# Patient Record
Sex: Female | Born: 1950 | Race: White | Hispanic: No | Marital: Married | Smoking: Former smoker
Health system: Southern US, Community
[De-identification: ages and names within clinical notes are randomized; demographics above are authoritative.]

## PROBLEM LIST (undated history)

## (undated) DIAGNOSIS — R55 Syncope and collapse: Secondary | ICD-10-CM

## (undated) DIAGNOSIS — K219 Gastro-esophageal reflux disease without esophagitis: Secondary | ICD-10-CM

## (undated) DIAGNOSIS — Z950 Presence of cardiac pacemaker: Secondary | ICD-10-CM

## (undated) DIAGNOSIS — E785 Hyperlipidemia, unspecified: Secondary | ICD-10-CM

## (undated) DIAGNOSIS — I469 Cardiac arrest, cause unspecified: Secondary | ICD-10-CM

## (undated) HISTORY — PX: BREAST LUMPECTOMY: SHX2

## (undated) HISTORY — DX: Hyperlipidemia, unspecified: E78.5

## (undated) HISTORY — DX: Presence of cardiac pacemaker: Z95.0

## (undated) HISTORY — DX: Gastro-esophageal reflux disease without esophagitis: K21.9

## (undated) HISTORY — DX: Cardiac arrest, cause unspecified: I46.9

## (undated) HISTORY — DX: Syncope and collapse: R55

## (undated) HISTORY — PX: DILATION AND CURETTAGE OF UTERUS: SHX78

---

## 1998-08-06 HISTORY — PX: BREAST EXCISIONAL BIOPSY: SUR124

## 1998-10-28 ENCOUNTER — Ambulatory Visit (HOSPITAL_COMMUNITY): Admission: RE | Admit: 1998-10-28 | Discharge: 1998-10-28 | Payer: Self-pay

## 1999-12-05 ENCOUNTER — Encounter: Admission: RE | Admit: 1999-12-05 | Discharge: 1999-12-05 | Payer: Self-pay | Admitting: Gynecology

## 1999-12-05 ENCOUNTER — Encounter: Payer: Self-pay | Admitting: Gynecology

## 2001-03-04 ENCOUNTER — Encounter: Admission: RE | Admit: 2001-03-04 | Discharge: 2001-03-04 | Payer: Self-pay | Admitting: Gynecology

## 2001-03-04 ENCOUNTER — Encounter: Payer: Self-pay | Admitting: Gynecology

## 2001-03-06 ENCOUNTER — Other Ambulatory Visit: Admission: RE | Admit: 2001-03-06 | Discharge: 2001-03-06 | Payer: Self-pay | Admitting: Gynecology

## 2001-03-28 ENCOUNTER — Ambulatory Visit (HOSPITAL_COMMUNITY): Admission: RE | Admit: 2001-03-28 | Discharge: 2001-03-28 | Payer: Self-pay | Admitting: Endocrinology

## 2001-03-28 ENCOUNTER — Encounter: Payer: Self-pay | Admitting: Endocrinology

## 2002-01-29 ENCOUNTER — Encounter (INDEPENDENT_AMBULATORY_CARE_PROVIDER_SITE_OTHER): Payer: Self-pay | Admitting: Specialist

## 2002-01-29 ENCOUNTER — Ambulatory Visit (HOSPITAL_COMMUNITY): Admission: RE | Admit: 2002-01-29 | Discharge: 2002-01-29 | Payer: Self-pay | Admitting: *Deleted

## 2002-03-20 ENCOUNTER — Encounter: Payer: Self-pay | Admitting: Gynecology

## 2002-03-20 ENCOUNTER — Encounter: Admission: RE | Admit: 2002-03-20 | Discharge: 2002-03-20 | Payer: Self-pay | Admitting: Gynecology

## 2002-03-24 ENCOUNTER — Other Ambulatory Visit: Admission: RE | Admit: 2002-03-24 | Discharge: 2002-03-24 | Payer: Self-pay | Admitting: Gynecology

## 2003-04-13 ENCOUNTER — Other Ambulatory Visit: Admission: RE | Admit: 2003-04-13 | Discharge: 2003-04-13 | Payer: Self-pay | Admitting: Gynecology

## 2003-05-07 ENCOUNTER — Encounter: Payer: Self-pay | Admitting: Gynecology

## 2003-05-07 ENCOUNTER — Encounter: Admission: RE | Admit: 2003-05-07 | Discharge: 2003-05-07 | Payer: Self-pay | Admitting: Gynecology

## 2004-05-18 ENCOUNTER — Other Ambulatory Visit: Admission: RE | Admit: 2004-05-18 | Discharge: 2004-05-18 | Payer: Self-pay | Admitting: Gynecology

## 2004-06-02 ENCOUNTER — Encounter: Admission: RE | Admit: 2004-06-02 | Discharge: 2004-06-02 | Payer: Self-pay | Admitting: Gynecology

## 2005-06-04 ENCOUNTER — Encounter: Admission: RE | Admit: 2005-06-04 | Discharge: 2005-06-04 | Payer: Self-pay | Admitting: Gynecology

## 2005-06-04 ENCOUNTER — Other Ambulatory Visit: Admission: RE | Admit: 2005-06-04 | Discharge: 2005-06-04 | Payer: Self-pay | Admitting: Gynecology

## 2006-05-05 ENCOUNTER — Emergency Department (HOSPITAL_COMMUNITY): Admission: EM | Admit: 2006-05-05 | Discharge: 2006-05-05 | Payer: Self-pay | Admitting: Emergency Medicine

## 2006-06-13 ENCOUNTER — Encounter: Admission: RE | Admit: 2006-06-13 | Discharge: 2006-06-13 | Payer: Self-pay | Admitting: Gynecology

## 2006-06-13 ENCOUNTER — Other Ambulatory Visit: Admission: RE | Admit: 2006-06-13 | Discharge: 2006-06-13 | Payer: Self-pay | Admitting: Gynecology

## 2006-07-05 ENCOUNTER — Ambulatory Visit (HOSPITAL_COMMUNITY): Admission: RE | Admit: 2006-07-05 | Discharge: 2006-07-05 | Payer: Self-pay | Admitting: *Deleted

## 2006-12-31 ENCOUNTER — Encounter: Admission: RE | Admit: 2006-12-31 | Discharge: 2006-12-31 | Payer: Self-pay | Admitting: Endocrinology

## 2007-01-09 ENCOUNTER — Ambulatory Visit: Payer: Self-pay | Admitting: Internal Medicine

## 2007-01-14 ENCOUNTER — Ambulatory Visit (HOSPITAL_COMMUNITY): Admission: RE | Admit: 2007-01-14 | Discharge: 2007-01-14 | Payer: Self-pay | Admitting: Internal Medicine

## 2007-04-25 ENCOUNTER — Encounter: Admission: RE | Admit: 2007-04-25 | Discharge: 2007-04-25 | Payer: Self-pay | Admitting: Endocrinology

## 2007-06-16 ENCOUNTER — Encounter: Admission: RE | Admit: 2007-06-16 | Discharge: 2007-06-16 | Payer: Self-pay | Admitting: Gynecology

## 2008-01-13 ENCOUNTER — Encounter: Admission: RE | Admit: 2008-01-13 | Discharge: 2008-01-13 | Payer: Self-pay | Admitting: Endocrinology

## 2008-02-04 ENCOUNTER — Telehealth (INDEPENDENT_AMBULATORY_CARE_PROVIDER_SITE_OTHER): Payer: Self-pay | Admitting: *Deleted

## 2008-02-16 ENCOUNTER — Encounter: Admission: RE | Admit: 2008-02-16 | Discharge: 2008-02-16 | Payer: Self-pay | Admitting: Internal Medicine

## 2008-05-05 ENCOUNTER — Encounter: Admission: RE | Admit: 2008-05-05 | Discharge: 2008-05-05 | Payer: Self-pay | Admitting: Otolaryngology

## 2008-06-17 ENCOUNTER — Encounter: Admission: RE | Admit: 2008-06-17 | Discharge: 2008-06-17 | Payer: Self-pay | Admitting: Gynecology

## 2008-08-15 ENCOUNTER — Inpatient Hospital Stay (HOSPITAL_COMMUNITY): Admission: EM | Admit: 2008-08-15 | Discharge: 2008-08-18 | Payer: Self-pay | Admitting: Emergency Medicine

## 2008-08-16 ENCOUNTER — Encounter (INDEPENDENT_AMBULATORY_CARE_PROVIDER_SITE_OTHER): Payer: Self-pay | Admitting: Emergency Medicine

## 2008-08-16 ENCOUNTER — Ambulatory Visit: Payer: Self-pay | Admitting: *Deleted

## 2008-08-16 ENCOUNTER — Encounter (INDEPENDENT_AMBULATORY_CARE_PROVIDER_SITE_OTHER): Payer: Self-pay | Admitting: Internal Medicine

## 2008-08-17 DIAGNOSIS — Z950 Presence of cardiac pacemaker: Secondary | ICD-10-CM

## 2008-08-17 HISTORY — DX: Presence of cardiac pacemaker: Z95.0

## 2008-08-17 HISTORY — PX: PERMANENT PACEMAKER INSERTION: SHX6023

## 2008-12-08 ENCOUNTER — Encounter: Admission: RE | Admit: 2008-12-08 | Discharge: 2008-12-08 | Payer: Self-pay | Admitting: Endocrinology

## 2009-06-21 ENCOUNTER — Encounter: Admission: RE | Admit: 2009-06-21 | Discharge: 2009-06-21 | Payer: Self-pay | Admitting: Gynecology

## 2010-02-28 IMAGING — CT CT HEAD W/O CM
1 series · 16 of 30 positions shown, 20 images · non-contrast
Comparison: None

CLINICAL DATA: Syncope, fall.

CT HEAD WITHOUT CONTRAST
TECHNIQUE: Contiguous axial images were obtained from the base of
the skull through the vertex without contrast.

[Series 2: head trauma 4.8 h37s · axial · 0.45mm/px · z∈[-97,+38]mm · 16 of 30 slices shown, 20 images]
[im 2/30  brain]
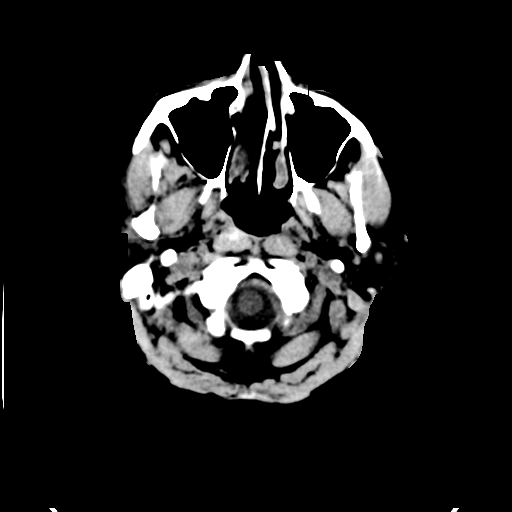
[im 2/30  bone]
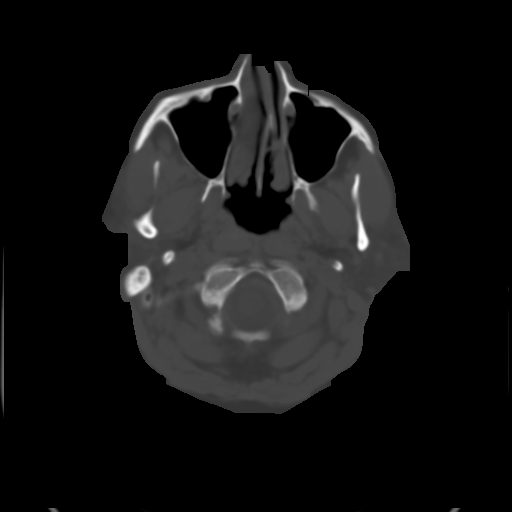
[im 4/30  brain]
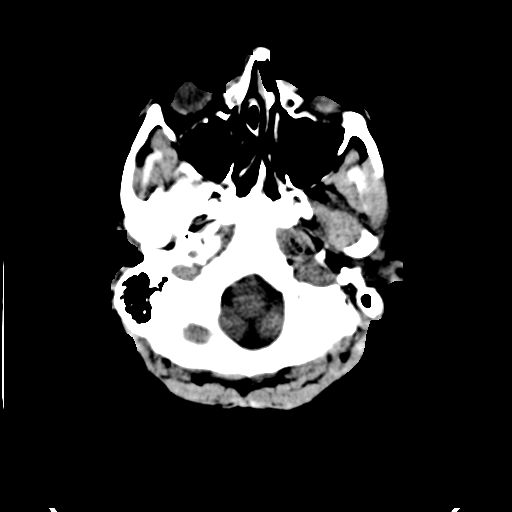
[im 6/30  brain]
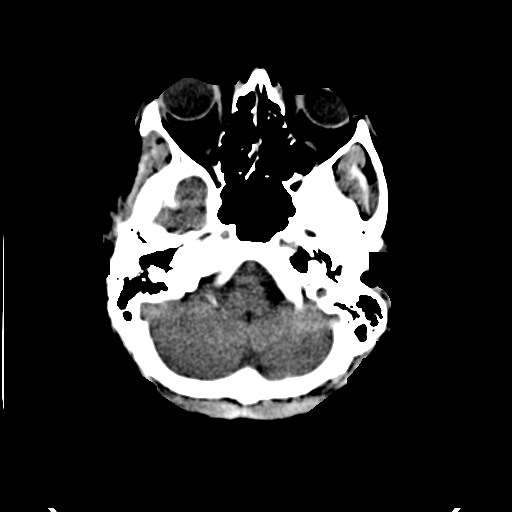
[im 8/30  brain]
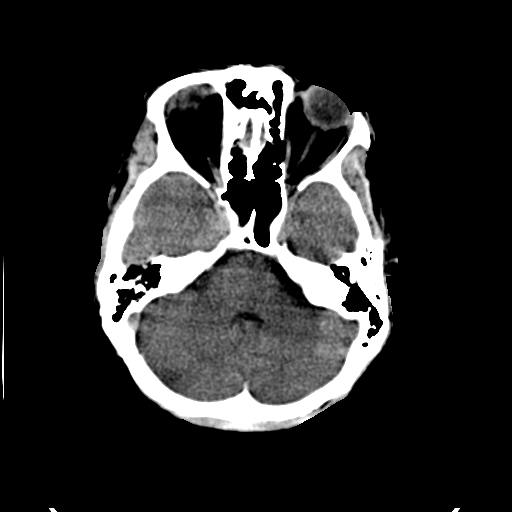
[im 9/30  brain]
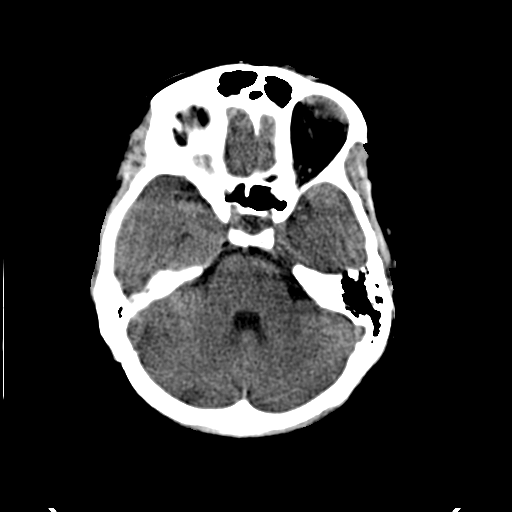
[im 9/30  bone]
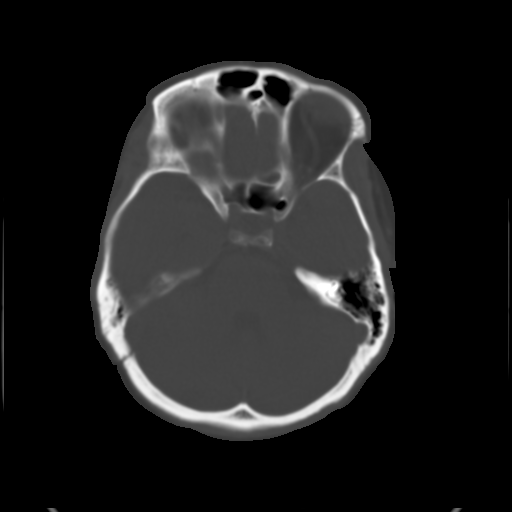
[im 11/30  brain]
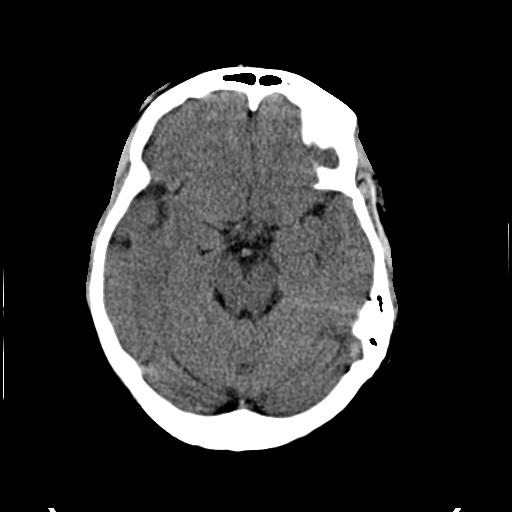
[im 13/30  brain]
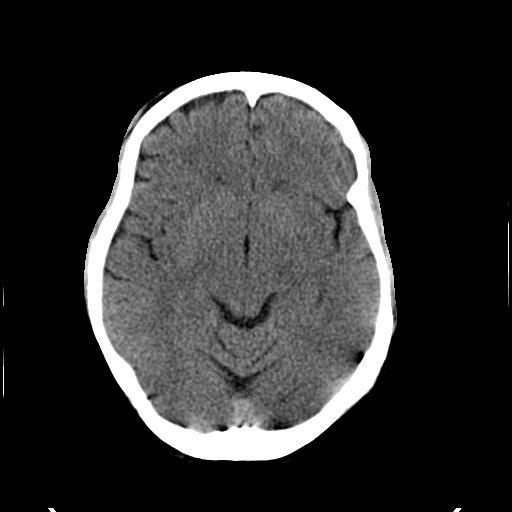
[im 15/30  brain]
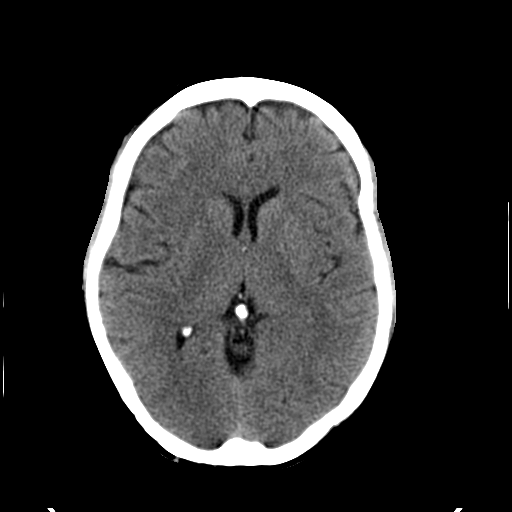
[im 16/30  brain]
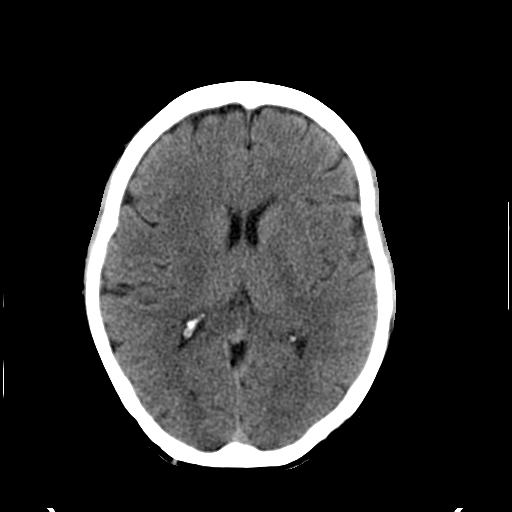
[im 16/30  bone]
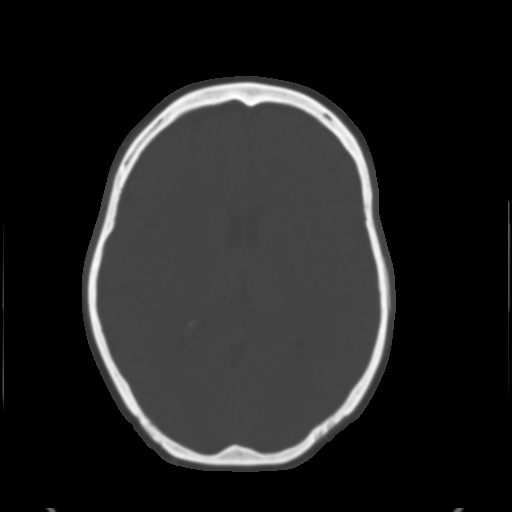
[im 18/30  brain]
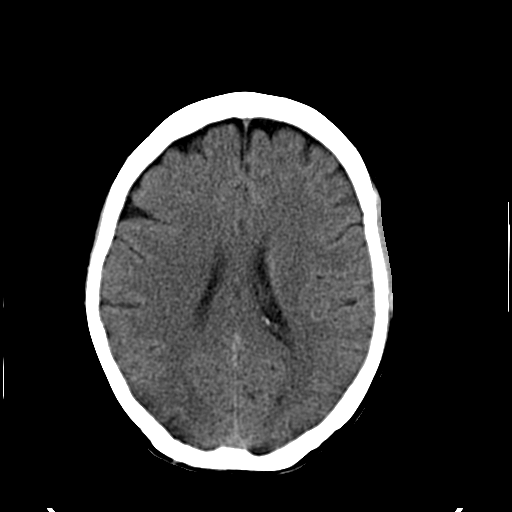
[im 20/30  brain]
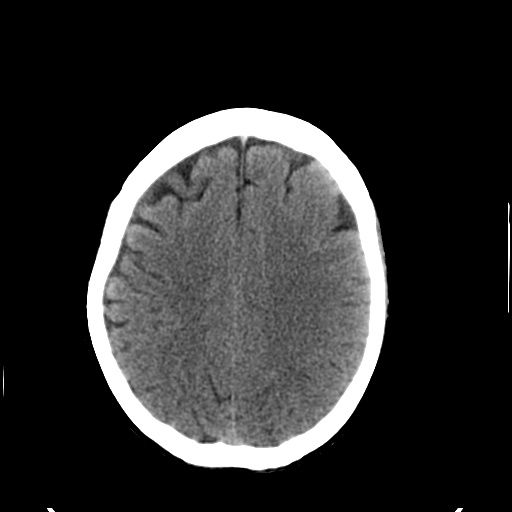
[im 22/30  brain]
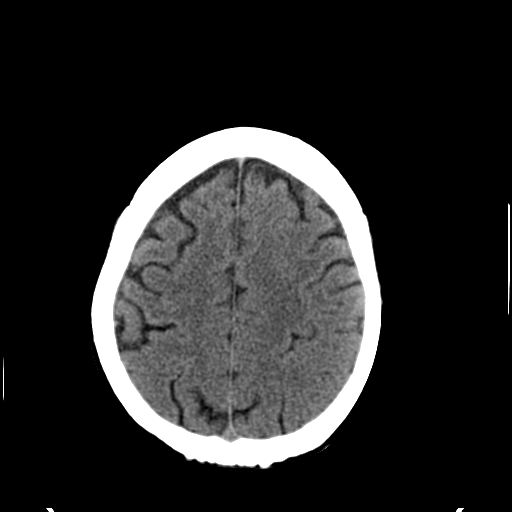
[im 23/30  brain]
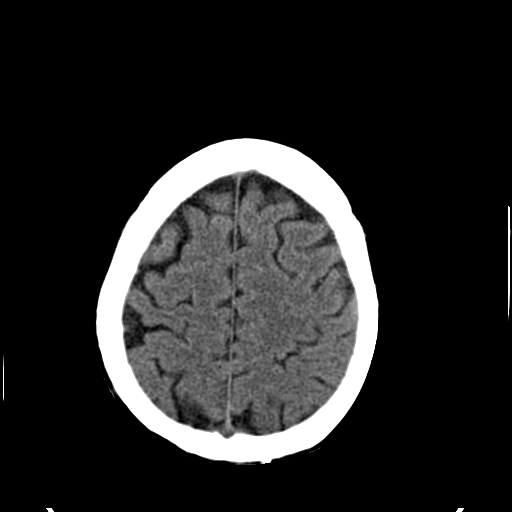
[im 23/30  bone]
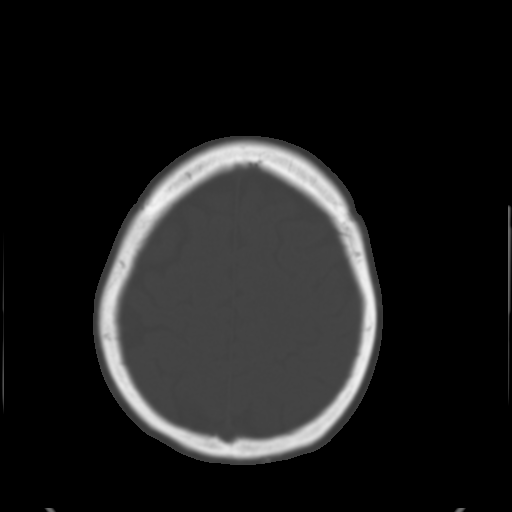
[im 25/30  brain]
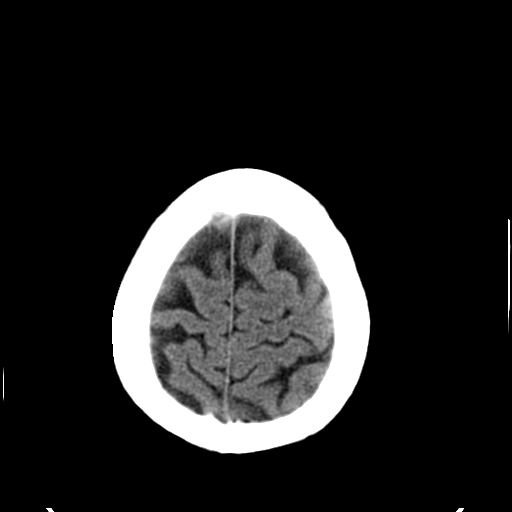
[im 27/30  brain]
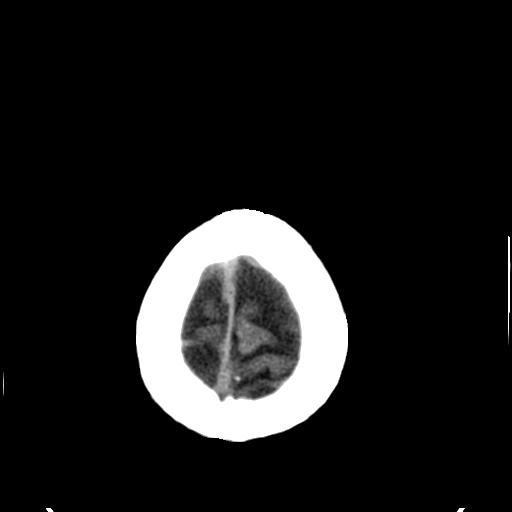
[im 29/30  brain]
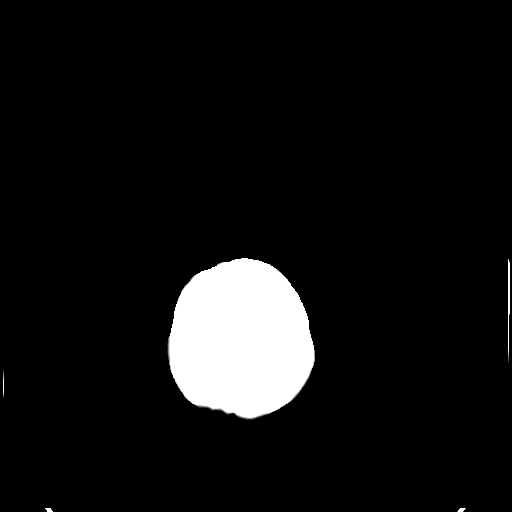

[16 of 30 positions shown; findings below may reference images not displayed]

FINDINGS: Soft tissue swelling noted over the right side of the
forehead. No acute intracranial abnormality.  Specifically, no
hemorrhage, hydrocephalus, mass lesion, acute infarction, or
significant intracranial injury.  No acute calvarial abnormality.

Visualized paranasal sinuses and mastoids clear.  Orbital soft
tissues unremarkable.
IMPRESSION: No acute intracranial abnormality.

## 2010-03-01 IMAGING — CR DG CHEST 2V
2 series · 2 of 2 positions shown · non-contrast
Comparison: [facility] images for chest CT dated 02/16/2008

CLINICAL DATA: Multiple syncopal episodes.

CHEST - 2 VIEW

[w chest pa]
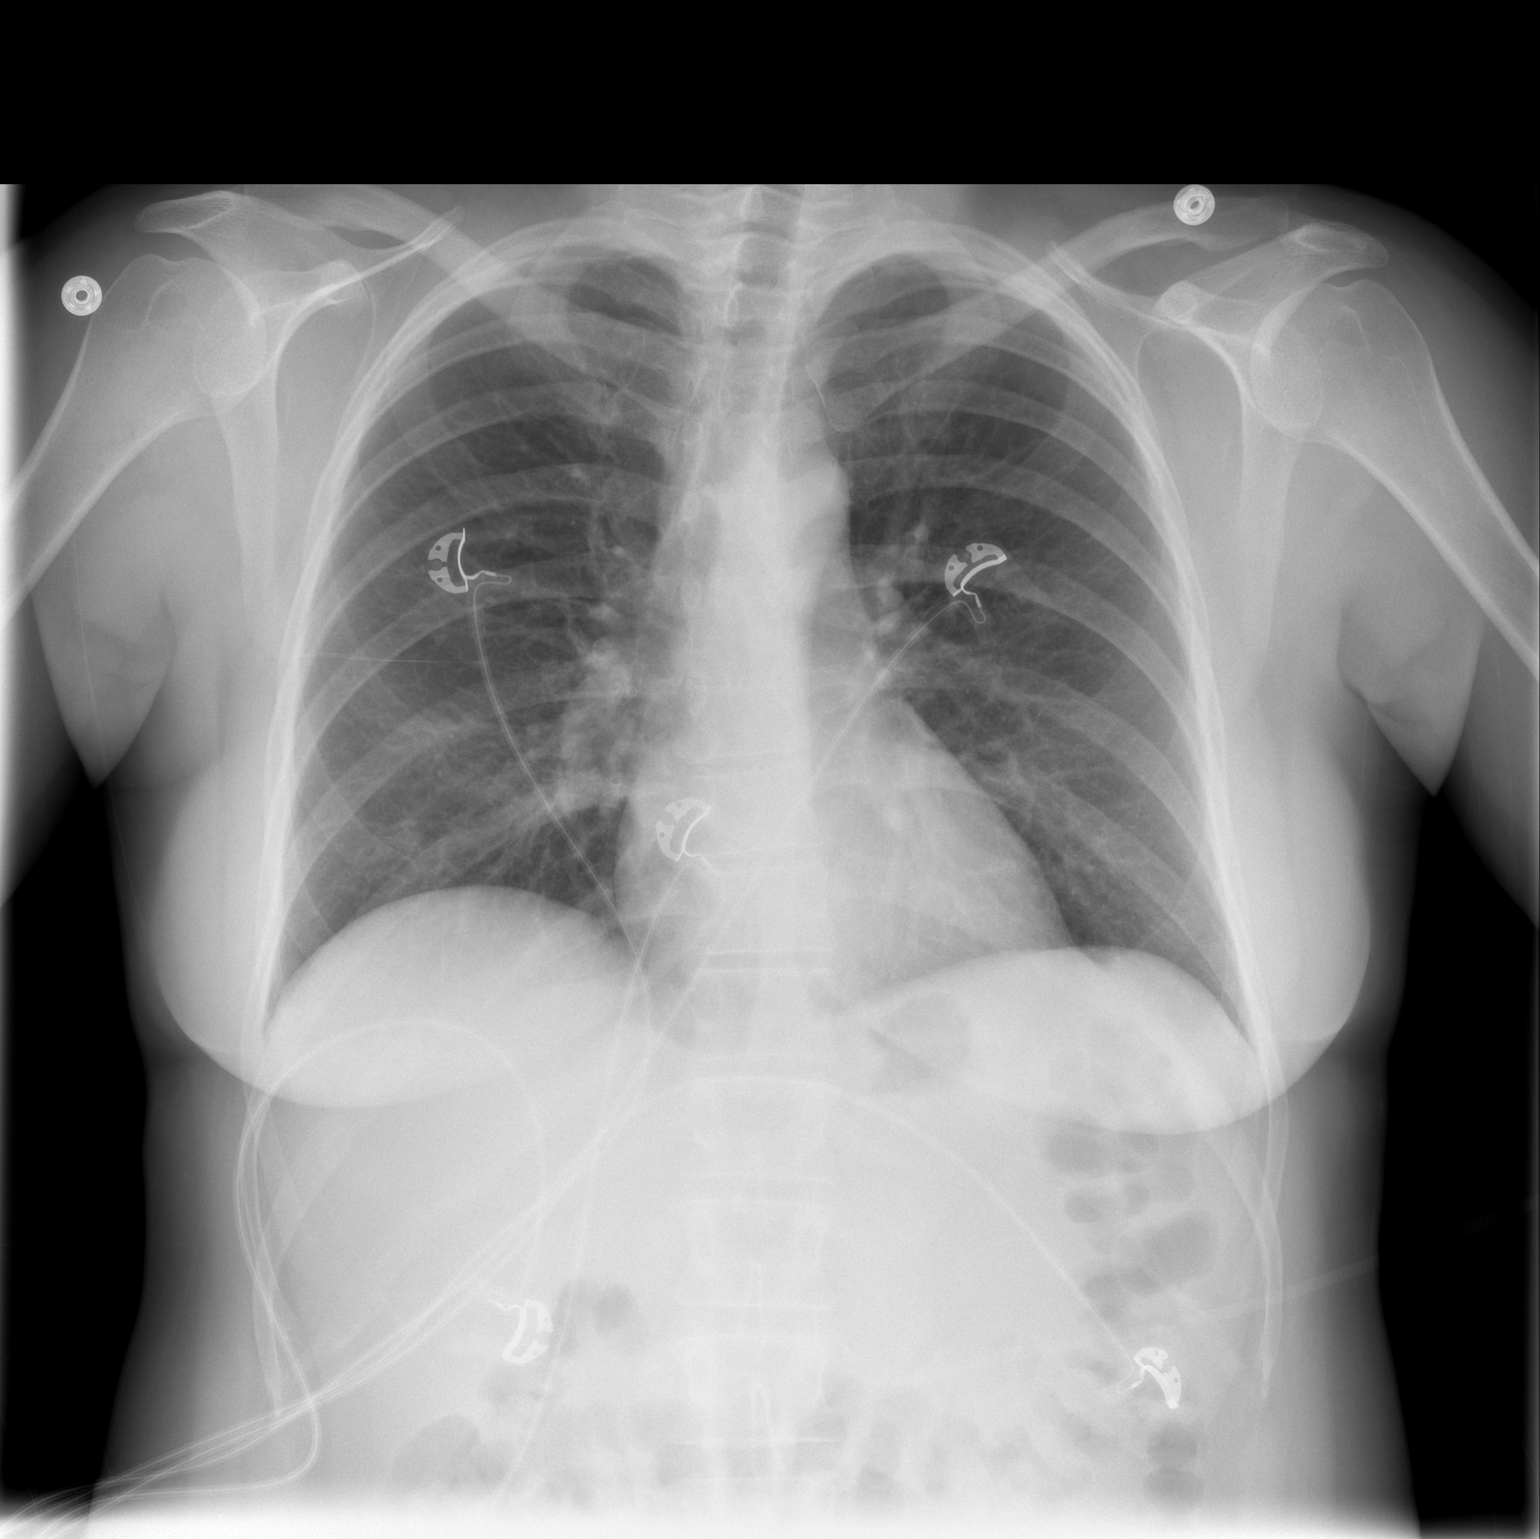

[w chest lat]
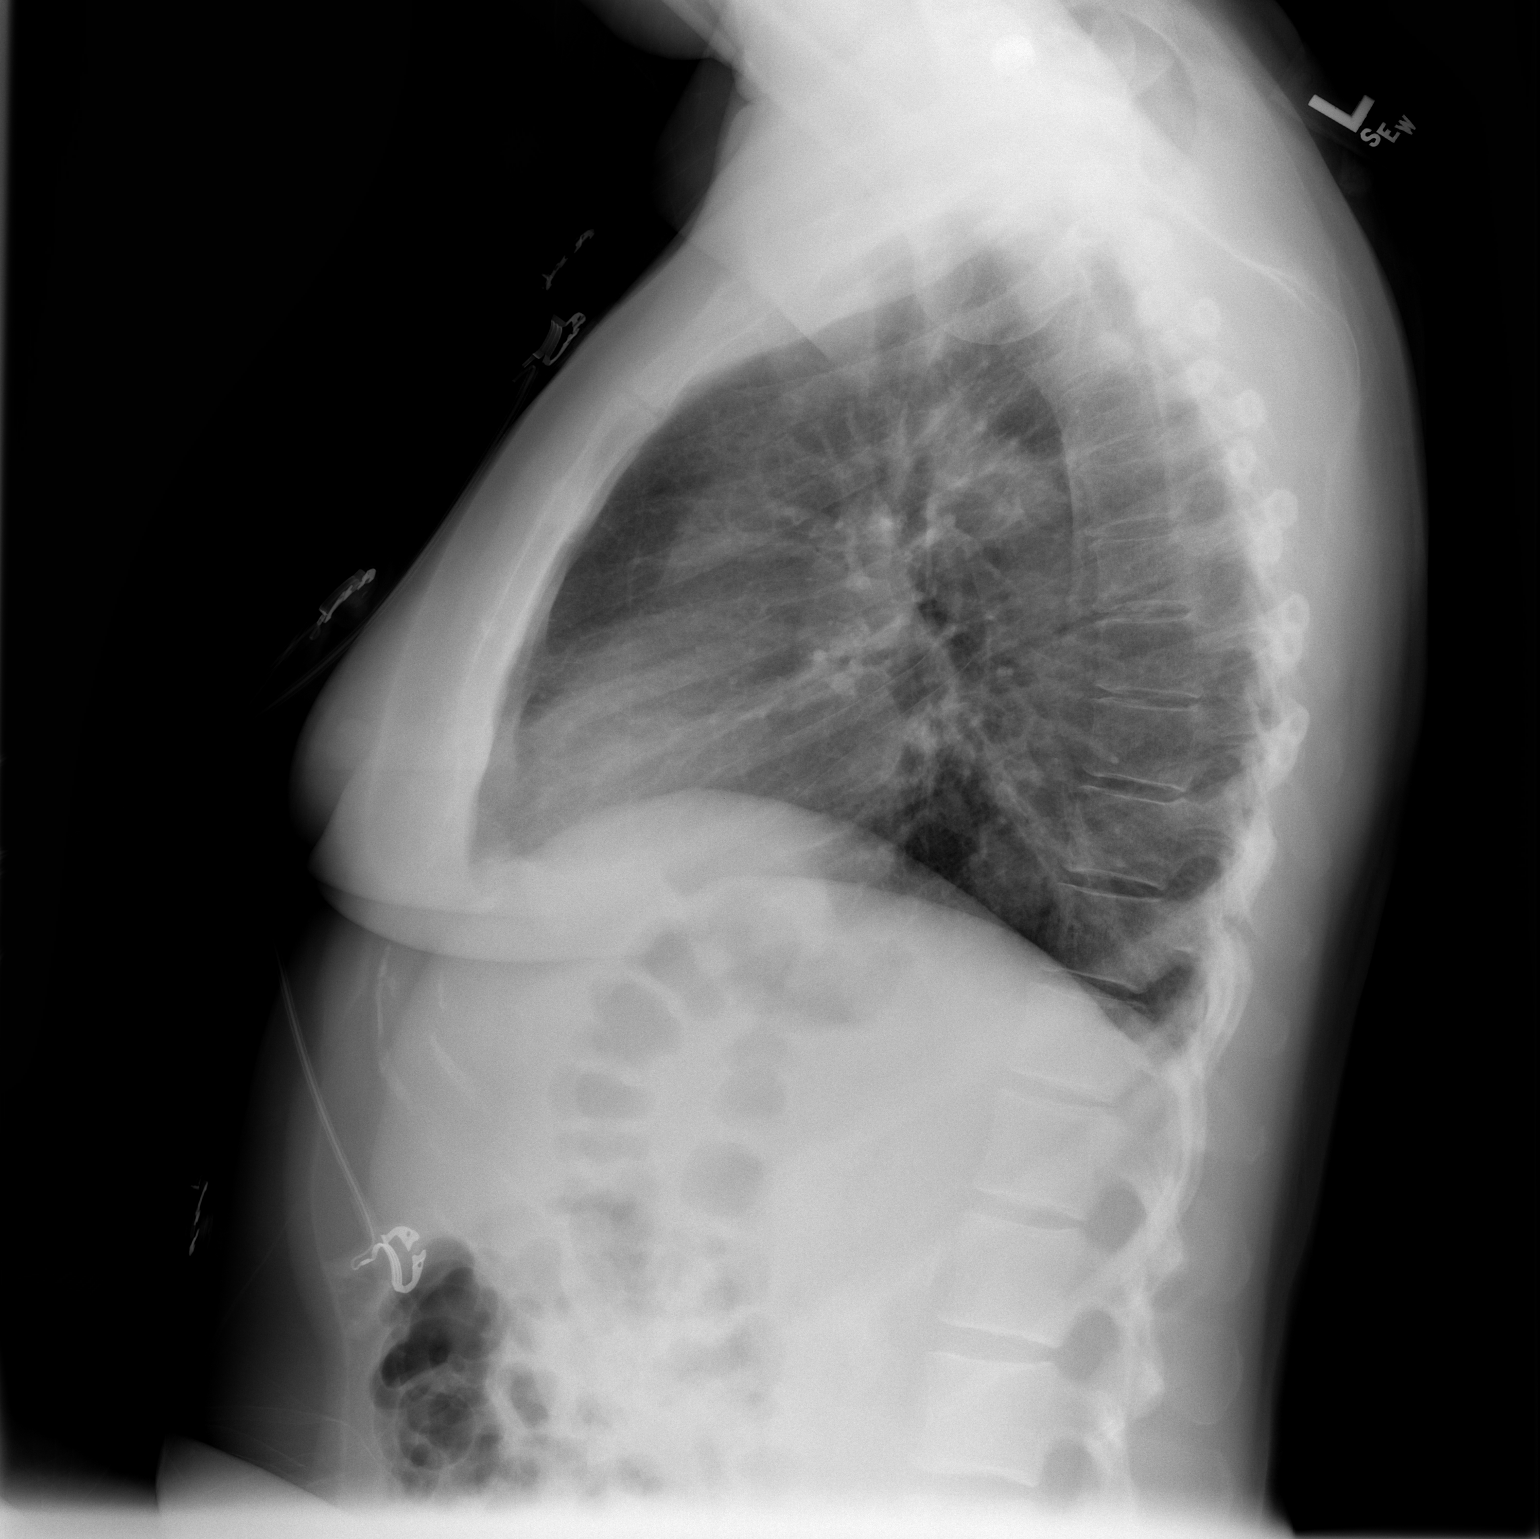

[2 of 2 positions shown; findings below may reference images not displayed]

FINDINGS: Heart size and vascularity are normal and the lungs are
clear.  No significant bony abnormality.
IMPRESSION: Normal chest.

## 2010-04-03 ENCOUNTER — Encounter: Admission: RE | Admit: 2010-04-03 | Discharge: 2010-04-03 | Payer: Self-pay | Admitting: Endocrinology

## 2010-07-10 ENCOUNTER — Encounter: Admission: RE | Admit: 2010-07-10 | Discharge: 2010-07-10 | Payer: Self-pay | Admitting: Gynecology

## 2010-08-27 ENCOUNTER — Encounter: Payer: Self-pay | Admitting: Endocrinology

## 2010-11-20 LAB — T4, FREE: Free T4: 0.99 ng/dL (ref 0.89–1.80)

## 2010-11-20 LAB — CBC
HCT: 38.9 % (ref 36.0–46.0)
HCT: 41.6 % (ref 36.0–46.0)
Hemoglobin: 13.1 g/dL (ref 12.0–15.0)
MCV: 90.6 fL (ref 78.0–100.0)
Platelets: 240 10*3/uL (ref 150–400)
RBC: 4.24 MIL/uL (ref 3.87–5.11)
RDW: 12.1 % (ref 11.5–15.5)
RDW: 12.3 % (ref 11.5–15.5)
RDW: 12.7 % (ref 11.5–15.5)
WBC: 4 10*3/uL (ref 4.0–10.5)
WBC: 5.7 10*3/uL (ref 4.0–10.5)

## 2010-11-20 LAB — PROTIME-INR
INR: 1.1 (ref 0.00–1.49)
Prothrombin Time: 14.5 seconds (ref 11.6–15.2)

## 2010-11-20 LAB — CARDIAC PANEL(CRET KIN+CKTOT+MB+TROPI)
CK, MB: 1.6 ng/mL (ref 0.3–4.0)
CK, MB: 1.7 ng/mL (ref 0.3–4.0)
CK, MB: 1.7 ng/mL (ref 0.3–4.0)
Relative Index: 1.4 (ref 0.0–2.5)
Relative Index: 1.5 (ref 0.0–2.5)
Relative Index: 1.6 (ref 0.0–2.5)
Total CK: 103 U/L (ref 7–177)
Troponin I: <0.01 ng/mL (ref 0.00–0.06)
Troponin I: <0.01 ng/mL (ref 0.00–0.06)

## 2010-11-20 LAB — BASIC METABOLIC PANEL
BUN: 3 mg/dL — ABNORMAL LOW (ref 6–23)
BUN: 9 mg/dL (ref 6–23)
Calcium: 9.1 mg/dL (ref 8.4–10.5)
Chloride: 108 mEq/L (ref 96–112)
Creatinine, Ser: 0.75 mg/dL (ref 0.4–1.2)
Creatinine, Ser: 0.8 mg/dL (ref 0.4–1.2)
GFR calc non Af Amer: >60 mL/min (ref >60)
GFR calc non Af Amer: >60 mL/min (ref >60)
Glucose, Bld: 111 mg/dL — ABNORMAL HIGH (ref 70–99)
Glucose, Bld: 118 mg/dL — ABNORMAL HIGH (ref 70–99)
Potassium: 3.4 mEq/L — ABNORMAL LOW (ref 3.5–5.1)
Potassium: 4 mEq/L (ref 3.5–5.1)

## 2010-11-20 LAB — URINALYSIS, ROUTINE W REFLEX MICROSCOPIC
Bilirubin Urine: NEGATIVE
Glucose, UA: NEGATIVE mg/dL
Ketones, ur: NEGATIVE mg/dL
Protein, ur: NEGATIVE mg/dL

## 2010-11-20 LAB — DIFFERENTIAL
Basophils Absolute: 0 10*3/uL (ref 0.0–0.1)
Eosinophils Absolute: 0 10*3/uL (ref 0.0–0.7)
Eosinophils Relative: 0 % (ref 0–5)
Neutrophils Relative %: 79 % — ABNORMAL HIGH (ref 43–77)

## 2010-11-20 LAB — COMPREHENSIVE METABOLIC PANEL
ALT: 25 U/L (ref 0–35)
Alkaline Phosphatase: 46 U/L (ref 39–117)
CO2: 24 mEq/L (ref 19–32)
Chloride: 111 mEq/L (ref 96–112)
GFR calc non Af Amer: >60 mL/min (ref >60)
Glucose, Bld: 130 mg/dL — ABNORMAL HIGH (ref 70–99)
Potassium: 3.6 mEq/L (ref 3.5–5.1)
Sodium: 141 mEq/L (ref 135–145)

## 2010-11-20 LAB — LIPID PANEL
HDL: 36 mg/dL — ABNORMAL LOW (ref >39)
VLDL: 14 mg/dL (ref 0–40)

## 2010-11-20 LAB — T3, FREE: T3, Free: 2.5 pg/mL (ref 2.3–4.2)

## 2010-11-20 LAB — GLUCOSE, CAPILLARY: Glucose-Capillary: 143 mg/dL — ABNORMAL HIGH (ref 70–99)

## 2010-11-20 LAB — HCG, SERUM, QUALITATIVE: Preg, Serum: NEGATIVE

## 2010-11-20 LAB — APTT: aPTT: 27 seconds (ref 24–37)

## 2010-11-20 LAB — POCT CARDIAC MARKERS

## 2010-11-20 LAB — POTASSIUM: Potassium: 3.4 mEq/L — ABNORMAL LOW (ref 3.5–5.1)

## 2010-11-20 LAB — T3: T3, Total: 65.3 ng/dl — ABNORMAL LOW (ref 80.0–204.0)

## 2010-11-20 LAB — D-DIMER, QUANTITATIVE: D-Dimer, Quant: 0.27 ug/mL-FEU (ref 0.00–0.48)

## 2010-11-20 LAB — TSH: TSH: 6.855 u[IU]/mL — ABNORMAL HIGH (ref 0.350–4.500)

## 2010-12-19 ENCOUNTER — Other Ambulatory Visit: Payer: Self-pay | Admitting: Gynecology

## 2010-12-19 DIAGNOSIS — N6453 Retraction of nipple: Secondary | ICD-10-CM

## 2010-12-19 NOTE — Procedures (Signed)
EEG NUMBER:  [id number].   Female patient currently in room 4703.   Admitted through Dr. Denton Lank, who was supposedly the ordering physician  for this EEG.   The patient is now on the [facility] D Team and has a chief complaint of  a syncopal episode and dyslipidemia.  The patient's activation  procedures were performed in form of photic stimulation in a portable 16-  channel EEG with one channel representing heart rate and rhythm  exclusively.  She is described as being on aspirin, Lovenox, Protonix,  Dilaudid, Tylenol, Zofran, oxycodone, and Phenergan.  The patient was  admitted on January 10 for multiple syncopal episodes, dyslipidemia, and  for a temporary pacemaker placement after significant ventricular  interval EKG pauses.  She had one episode last night prior to presenting  for the EEG, fell in the bathroom here at the hospital, and now has a  swelling and hematoma over the right temple.   A posterior dominant background rhythm for this patient could not be  established at the beginning of the recording, as the patient is clearly  asleep.  She shows bitemporal and bicentral fast activity, but appears relaxed.  Again by description, a technician entered the room and the patient's  mother is at the bedside.  The patient was described as licking her lips  when she awoke from sleep and shows no drowsy theta range frequencies.  With her eyes closed, she immediately drifts back to her drowsy and  sleep stages.  The technician then spoke to the patient who resumed now  a posterior dominant rhythm of 9 Hz that is very symmetrically formed.  Photic stimulation shows photic entrainment at 7, 9, 11, 13, and 15 Hz.  During the photic stimulation maneuver, it is noted that a nurse entered  the room multiple times and the study was not performed under the best  of circumstances or under the best of preparations.  However, epileptiform activity is not seen here.  The patient is clearly  drowsy,  but she shows no abnormal slowing, focal slowing, or  epileptiform activity and from the clinical information that I can  gather showed no evidence of  convulsions or absolves after her photic stimulation was performed.  Sleep architecture is normal.      Melvyn Novas, M.D.  Electronically Signed     [id number]  D:  08/17/2008 09:18:01  T:  08/17/2008 21:55:04  Job #:  [id number]   cc:   [facility]   Dr. Denton Lank

## 2010-12-19 NOTE — H&P (Signed)
NAMEJOURNI, Sherry Davis             ACCOUNT NO.:  [id number]   MEDICAL RECORD NO.:  [id number]          PATIENT TYPE:  EMS   LOCATION:  MAJO                         FACILITY:  [facility]   PHYSICIAN:  Ladell Pier, M.D.   DATE OF BIRTH:  1951-02-18   DATE OF ADMISSION:  08/15/2008  DATE OF DISCHARGE:                              HISTORY & PHYSICAL   CHIEF COMPLAINT:  Syncopal episode.   HISTORY OF PRESENT ILLNESS:  The patient is a 60 year old white female  with past medical history significant for GERD and dyslipidemia and  overactive bladder.  The patient stated that at about 3:30 this morning  her cat used the litter. She got up to change the litter, when she sat  up she felt lightheaded.  By the time she got into the kitchen where the  litter box was, she had to sit on the floor.  She felt extremely  lightheaded. After about 10 seconds she felt a little better. She went  to get something in the kitchen to drink to see if she would feel  better.  Since then this has  happened about 7-8 times.  Her mother  noted at one of the episodes she became unconscious, which lasted for 3-  4 seconds, and did not have any jerking spells.  Once she recovered she  was able to answer questions, did not have any confusion.  She stated  that her husband noted one time that she had passed out and she vomited.  And she was jerking.  She has had no chest pain.  No shortness of  breath.  No diarrhea.  No headaches.   PAST MEDICAL HISTORY:  Significant for GERD, dyslipidemia, overactive  bladder, and breast lumpectomy and D and C.   FAMILY HISTORY:  Mother is healthy, and father has history of prostate  cancer and skin cancer.   SOCIAL HISTORY:  She does not smoke, does not drink alcohol.  No IV drug  use.  She is married.  No children.  She has a scheduled appointment  with the breast center at [facility].   MEDICATIONS:  Crestor 20 mg q.h.s., Nexium 40 mg daily, Detrol LA 2 mg  daily.   ALLERGIES:  PENICILLIN.   REVIEW OF SYSTEMS:  Negative. Otherwise stated in the HPI.   PHYSICAL EXAM:  Temperature 97.7, blood pressure 144, 73, pulse 88,  respirations 15, pulse ox 100%.  GENERAL:  Patient is sitting up on stretcher. Does not seem to be in any  acute distress.  HEENT: Normocephalic, atraumatic.  Pupils equal and reactive to light  and without edema.  CARDIOVASCULAR:  Regular rate and rhythm with a 1/6 systolic murmur.  LUNGS:  Clear bilaterally.  No wheezes, rhonchi or rales.  ABDOMEN: Soft, nontender, nondistended.  Positive bowel sounds.  EXTREMITIES:  No edema.  NEUROLOGICAL:  Exam is nonfocal.  Cranial nerves II-XII intact.  Strength 5/5 throughout.  Finger-to-nose intact.   LABS:  WBC 5.5, hemoglobin 40.3, MCV 90.6, platelets 240, myoglobin  64.8, MB less than 1. Troponin less than 0.05. D-dimer 0.27.  Sodium  140, potassium 4, chloride  108, CO2 of 23, glucose 118, BUN 8.9,  creatinine 0.75, calcium 9.6.   ASSESSMENT/PLAN:  1. Syncopal episode, unsure of etiology.  2. Gastroesophageal reflux disease.  3. Overactive bladder.  4. Dyslipidemia.   Will admit the patient to the hospital.  Will do a syncopal workup with  carotid Dopplers, 2-D echo. Will get a head CT of the brain. Will also  get an EEG with her episode of jerking all over, but she does not have a  postictal episode, so I doubt that she is having a seizure, and the  episodes last for seconds. Will also check an EKG. Will look at a UA,  get a fasting lipid panel, CMP, cycle cardiac enzymes. Will continue her  on her home medications. Will also check orthostatic blood pressure.      Ladell Pier, M.D.  Electronically Signed     NJ/MEDQ  D:  08/15/2008  T:  08/15/2008  Job:  [id number]   cc:   Brooke Bonito, M.D.

## 2010-12-19 NOTE — Op Note (Signed)
NAMERUBINA, BASINSKI             ACCOUNT NO.:  [id number]   MEDICAL RECORD NO.:  [id number]          PATIENT TYPE:  INP   LOCATION:  2907                         FACILITY:  [facility]   PHYSICIAN:  Ritta Slot, MD     DATE OF BIRTH:  Apr 05, 1951   DATE OF PROCEDURE:  DATE OF DISCHARGE:                               OPERATIVE REPORT   TEMPORARY PACEMAKER WIRE INSERTION   INDICATIONS:  Ms. Sherry Davis is a very pleasant 60 year old female  who is admitted to [facility] with syncopal episodes whilst in  hospital last night.  She had 12-second pause, passed out, and sustained  significant bruise on her right eye.  She continued to have episodes  like that throughout the day today, and we were consulted for further  management.  Indications of risks and benefits of insertion of a  permanent pacemaker and temporary pacemaker system were discussed with  the patient.   DESCRIPTION OF PROCEDURE:  After informed consent, the patient was  brought to the second floor cardiac catheterization of [facility] where she proceeded to undergo temporary pacer wire insertion.   1. Sedation with 1 mg of Versed and 25 mcg of fentanyl.  2. Approach RFA 6-French sheath.  3. Temporary wire was inserted using fluoroscopy and was placed      without difficulty.  The threshold is found to be 0.1 mA at a rate      of 110 beats per minute.  Her pacemaker was set at 50 beats per      minute with 2 mA output.   PLAN:  She will return to the CCU in the stable condition.  She will  have a pacemaker inserted the following day.      Ritta Slot, MD  Electronically Signed     HS/MEDQ  D:  08/16/2008  T:  08/17/2008  Job:  [id number]

## 2010-12-19 NOTE — Assessment & Plan Note (Signed)
[facility]                             PULMONARY OFFICE NOTE   NAME:Sherry Davis, Sherry Davis                    MRN:          [id number]  DATE:01/09/2007                            DOB:          1950-11-24    REFERRING PHYSICIAN:  Brooke Bonito, M.D.   REASON FOR CONSULTATION:  Abnormal CT scan.   CHIEF COMPLAINT:  Cough.   HISTORY:  This is a very nice, 60 year old, white female who comes in  with an 8-year history of cough with sensation of throat congestion 6-  10 times per day with about a half a teaspoon of mucous maximally  coughed up each time. This happens about 10-15 minutes after she stirs  around in the morning and occasionally bothers her while she sleeps but  feels she is some better after being started on Singulair by Dr.  Juleen China. I had seen her previously thinking she probably had a cyclical  cough with a component of reflux but she did not return for her second  followup visit.   In the meantime, a chest x-ray and CT scan have indicated that she might  have a right middle lobe mass and therefore she is seen for this  purpose. She denies any history of hemoptysis, purulent sputum, recent  pneumonia, pleuritic pain, fevers, chills, sweats, or unintended weight  loss.   The patient states she coughs so bad she gags and vomits but does not  think she is refluxing and that is why she did not return to see me in  2005 after her evaluation was done which included a normal chest x-ray  and sinus CT scan.   PAST MEDICAL HISTORY:  Significant for hyperlipidemia and sinus  problems.   ALLERGIES:  PENICILLIN.   MEDICATIONS:  Nexium, Detrol, Crestor, Ecotrin, magnesium, fish oil,  Citracal and D3.   SOCIAL HISTORY:  She quit smoking in 1985. She works as a Therapist, art.   FAMILY HISTORY:  Positive for cancer of the prostate and skin in her  father.   REVIEW OF SYSTEMS:  Taken in detail on the worksheet and negative except  as  outlined above.   PHYSICAL EXAMINATION:  GENERAL:  This is a somewhat depressed-appearing,  ambulatory, white female in no acute distress.  VITAL SIGNS:  She had stable vital signs.  HEENT:  Significant for frequent throat clearing, however, there was no  evidence of excessive post nasal drainage or cobblestoning.  NECK:  Supple without cervical adenopathy or tenderness.  LUNGS:  Lung fields were perfectly clear bilaterally to auscultation and  percussion with no cough elicited on inspiratory or expiratory  maneuvers.  HEART:  Heart has a regular rate and rhythm without murmur, gallop or  rub present.  ABDOMEN:  Soft and benign with no palpable organomegaly, mass or  tenderness.  EXTREMITIES:  Warm without calf tenderness, cyanosis, clubbing or edema.   Chest x-ray from Dec 25, 2006 shows a definite right middle lobe density  which was not present on previous films and which on CT scan dated May  27 is an 8 x 12 mm  indeterminate size right middle lobe nodule with no  definite pathologic adenopathy.   She did have a left retrocrural lymph node but it did not reach the 2 cm  pathologic threshold.   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 I suspect she has rhinitis  with post nasal drip syndrome but then wonder why her cough gets so bad  that she vomits if she does not have reflux.   I sat with this patient for probably 30 minutes going over the strategy  for managing a cyclical cough emphasizing that even the world's expert  takes 14 visits to complete a workup for chronic cough and that for her  to expect for [place] to do it in one is not reasonable. However, I did give  her a strategy to use as follows:   1. Continue Singulair 10 mg daily and add Drixoral Cold and Allergy      for the sensation of post nasal drainage, suppress reflux with       Nexium combined with Reglan for at least a 4 week trial and take      prednisone for 6 days for the inflammatory component typically      associated with cyclical coughing.  2. In terms of the nodules, a PET scan would be appropriate and if      positive I would go to an excisional biopsy immediately. If not I      would follow her up with a limited CT scan in 3 months and consider      excision if there is any growth in the lesion.     Charlaine Dalton. Sherene Sires, MD, [facility]  Electronically Signed    MBW/MedQ  DD: 01/09/2007  DT: 01/10/2007  Job #: [id number]   cc:   Brooke Bonito, M.D.

## 2010-12-19 NOTE — Discharge Summary (Signed)
Sherry Davis, Sherry Davis             ACCOUNT NO.:  [id number]   MEDICAL RECORD NO.:  [id number]          PATIENT TYPE:  INP   LOCATION:  2012                         FACILITY:  [facility]   PHYSICIAN:  Herbie Saxon, MDDATE OF BIRTH:  29-Jan-1951   DATE OF ADMISSION:  08/15/2008  DATE OF DISCHARGE:  08/18/2008                               DISCHARGE SUMMARY   DISCHARGE DIAGNOSES:  1. 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  2. Gastroesophageal reflux disease.  3. Dyslipidemia.  4. Hypokalemia being supplemented.  5. Right forehead hematoma resolving.  6. History of overactive bladder.   CONSULTS:  Dr. Ritta Slot, MD, [facility].   PROCEDURE:  The patient had a dual-chamber pacemaker inserted on August 17, 2008.   HOSPITAL COURSE:  This 60 year old lady presented to the Emergency Room  [facility] after having had a syncopal episode.  No seizure  activities were noted.  On this admission, the patient was noted to be  having severe bradycardia as of August 16, 2008, that has been a  cardiology evaluation.  The 12-lead EKG was showing Mobitz type II heart  block.  The patient subsequently did go into complete heart block.  She  was scheduled and had dual-chamber pacemaker placed.  The patient has  been asymptomatic since this admission, no more recurrent syncopal  episodes.  Carotid Doppler showed 40%-60% of the internal carotid artery  stenosis.  Her fasting lipid only showed borderline low HDL level of 36.  A 2-D echocardiogram on this admission was performed on August 16, 2008, showed normal left ventricular systolic function.  No left  ventricular regional wall abnormalities.  She does have a mild mitral  valve regurgitation.   RADIOLOGY:  Chest x-ray on August 18, 2008, showed dual-lead  transvenous pacemaker is in place.  Blunting of the costophrenic angle  consistent with a small amount of pleural  effusion with associated  atelectasis.  Chest x-ray on August 16, 2008, showed normal chest.  CT  head on August 15, 2008, showed no acute intracranial abnormalities.   DISCHARGE CONDITION:  Stable.   Noted prior to this, the patient had borderline hypokalemia which has  been repleated already.  She is to have a repeat BMP with her primary  care physician Dr. Juleen China on August 19, 2008, to monitor his potassium  level.  Of note, also her initial TSH was elevated at 6.8 and T3 was low  at 65.  However, a repeat TFT shows normal TSH and T3 levels.  She was  initially started on statin, but this has been held.  The primary care  physician to consider repeating the TFT in the next 4-6 weeks as an  outpatient.  The patient is to keep the pacemaker wound dry for the next  1 week.  She will follow up with Dr. Lynnea Ferrier for wound site check on  August 26, 2008.  Follow up with Dr. Juleen China tomorrow on August 19, 2008, to repeat the BMP.   MEDICATIONS ON DISCHARGE:  She will continue with her home  medications  of,  1. Nexium 40 mg daily.  2. Detrol LA 2 mg daily.  3. Crestor 20 mg at bedtime.  4. Enteric-coated aspirin 81 mg daily.  5. Potassium chloride 20 mEq daily for 2 Mondays.   PHYSICAL EXAMINATION:  GENERAL:  On examination, she is a middle-aged  lady, not in acute respiratory distress.  VITAL SIGNS:  Temperature is 98, pulse is 86, respiratory rate is 18,  and blood pressure 126/81.  The right forehead bruise is receding.  HEENT:  Pupils are equal, reacting to light and accommodation.  Oropharynx and nasopharynx are clear.  NECK:  Supple.  CHEST:  Clinically clear.  CARDIAC:  Heart sounds 1 and 2, regular rate and rhythm, no gallop,  rubs, or murmurs appreciated.  ABDOMEN:  Benign.  CNS:  No deficits.  EXTREMITIES:  Peripheral pulses are present.  No edema.  No calf  erythema.   LABORATORY DATA:  Potassium is 3.4.  Complete blood count; WBC is 5,  hematocrit 38, and platelet  count is 197.  Chemistry; sodium is 138,  chloride 106, bicarbonate 25, glucose 111, BUN 3, and creatinine is 0.8.   Pulses are present throughout.  We will repeat chest-ray as outpatient  to monitor the atelectasis and this could be done in the next 5 days to  1 week.      Herbie Saxon, MD  Electronically Signed     MIO/MEDQ  D:  08/18/2008  T:  08/19/2008  Job:  [id number]   cc:   Ritta Slot, MD  Brooke Bonito, M.D.

## 2010-12-21 ENCOUNTER — Ambulatory Visit
Admission: RE | Admit: 2010-12-21 | Discharge: 2010-12-21 | Disposition: A | Discharge status: Home / Self Care | Payer: PRIVATE HEALTH INSURANCE | Source: Ambulatory Visit | Attending: Gynecology | Admitting: Gynecology

## 2010-12-21 DIAGNOSIS — N6453 Retraction of nipple: Secondary | ICD-10-CM

## 2010-12-22 NOTE — Procedures (Signed)
[facility]  Patient:    Sherry Davis, Sherry Davis Visit Number: [id number] MRN: [id number]          Service Type: END Location: ENDO Attending Physician:  Sabino Gasser Dictated by:   Sabino Gasser, M.D. Proc. Date: 01/29/02 Admit Date:  01/29/2002   CC:         Gretta Cool, M.D.   Procedure Report  PROCEDURE:  Upper endoscopy.  INDICATION:  Hemoccult positivity.  ANESTHESIA:  Demerol 60, Versed 6 mg.  DESCRIPTION OF PROCEDURE:  With the patient mildly sedated in the left lateral decubitus position, the Olympus videoscopic endoscope was inserted into the mouth and passed under direct vision through the esophagus, which appeared normal, into the stomach; fundus, body, antrum, duodenal bulb, and second portion of duodenum were all well-visualized.  From this point, the endoscope was slowly withdrawn, taking circumferential views of the entire duodenal mucosa until the endoscope then pulled back into the stomach and placed in retroflexion to view the stomach from below.  The endoscope was then straightened and withdrawn, taking circumferential views of the remaining gastric and esophageal mucosa, stopping in the body and fundus of the stomach where diffuse erythema was seen, photographed, and biopsied.  The patients vital signs and pulse oximeter remained stable.  The patient tolerated the procedure well without apparent complications.  FINDINGS:  Changes of gastritis in body and fundus of stomach.  PLAN: 1. Await biopsy report. 2. The patient will call me for results and follow up with me as an    outpatient. Dictated by:   Sabino Gasser, M.D. Attending Physician:  Sabino Gasser DD:  01/29/02 TD:  01/31/02 Job: [id number]

## 2010-12-22 NOTE — Op Note (Signed)
NAMEFIONNA, Sherry Davis             ACCOUNT NO.:  [id number]   MEDICAL RECORD NO.:  [id number]          PATIENT TYPE:  AMB   LOCATION:  ENDO                         FACILITY:  [facility]   PHYSICIAN:  Georgiana Spinner, M.D.    DATE OF BIRTH:  June 02, 1951   DATE OF PROCEDURE:  07/05/2006  DATE OF DISCHARGE:                               OPERATIVE REPORT   PROCEDURE:  Colonoscopy.   INDICATIONS:  Rectal bleeding.   ANESTHESIA:  Fentanyl 70 mcg, Versed 6 mg.   DESCRIPTION OF PROCEDURE:  With the patient mildly sedated in the left  lateral decubitus position, the Olympus videoscopic colonoscope was  inserted into the rectum and passed under direct vision to the cecum  identified by the ileocecal valve and appendiceal orifice, both of which  were photographed. From this point, the colonoscope was slowly withdrawn  and taking circumferential views of the colonic mucosa stopping in the  rectum which appeared normal on direct and showed hemorrhoids on  retroflexed view. The endoscope was straightened and withdrawn.  The  patient's vital signs and pulse oximeter remained stable.  The patient  tolerated the procedure well without apparent complications.   FINDINGS:  Internal hemorrhoids, external hemorrhoids as well.  Otherwise, unremarkable exam.   PLAN:  Consider repeat examination in five years.           ______________________________  Georgiana Spinner, M.D.     GMO/MEDQ  D:  07/05/2006  T:  07/05/2006  Job:  [id number]

## 2010-12-22 NOTE — Procedures (Signed)
[facility]  Patient:    [name], Sherry Davis Visit Number: [id number] MRN: [id number]          Service Type: END Location: ENDO Attending Physician:  Sabino Gasser Dictated by:   Sabino Gasser, M.D. Proc. Date: 01/29/02 Admit Date:  01/29/2002   CC:         Gretta Cool, M.D.   Procedure Report  PROCEDURE:  Colonoscopy.  INDICATIONS:  Hemoccult positivity.  DESCRIPTION OF PROCEDURE:  With the patient mildly sedated in the left lateral decubitus position, the Olympus videoscopic colonoscope was inserted into the rectum and passed under direct vision to the cecum, identified by the ileocecal valve and appendiceal orifice, both of which were photographed. From this point, the colonoscope was slowly withdrawn, taking circumferential views of the entire colonic mucosa, stopping only in the rectum which appeared normal on direct and showed hemorrhoids on retroflexed view.  The endoscope was straightened and withdrawn.  The patients vital signs and pulse oximeter remained stable.  The patient tolerated the procedure well without apparent complications.  FINDINGS: 1. Internal hemorrhoids. 2. Otherwise an unremarkable colonoscopic examination to the cecum.  PLAN:  See endoscopy note for further details. Dictated by:   Sabino Gasser, M.D. Attending Physician:  Sabino Gasser DD:  01/29/02 TD:  01/31/02 Job: [id number]

## 2011-03-23 ENCOUNTER — Ambulatory Visit
Admission: RE | Admit: 2011-03-23 | Discharge: 2011-03-23 | Disposition: A | Discharge status: Home / Self Care | Payer: PRIVATE HEALTH INSURANCE | Source: Ambulatory Visit | Attending: Emergency Medicine | Admitting: Emergency Medicine

## 2011-03-23 ENCOUNTER — Other Ambulatory Visit: Payer: Self-pay | Admitting: Emergency Medicine

## 2011-03-23 DIAGNOSIS — Z780 Asymptomatic menopausal state: Secondary | ICD-10-CM

## 2011-05-15 ENCOUNTER — Other Ambulatory Visit: Payer: Self-pay | Admitting: Endocrinology

## 2011-05-15 DIAGNOSIS — Z780 Asymptomatic menopausal state: Secondary | ICD-10-CM

## 2011-06-20 ENCOUNTER — Other Ambulatory Visit: Payer: Self-pay | Admitting: Gynecology

## 2011-06-20 DIAGNOSIS — Z1231 Encounter for screening mammogram for malignant neoplasm of breast: Secondary | ICD-10-CM

## 2011-07-12 ENCOUNTER — Ambulatory Visit
Admission: RE | Admit: 2011-07-12 | Discharge: 2011-07-12 | Disposition: A | Discharge status: Home / Self Care | Payer: PRIVATE HEALTH INSURANCE | Source: Ambulatory Visit | Attending: Gynecology | Admitting: Gynecology

## 2011-07-12 DIAGNOSIS — Z1231 Encounter for screening mammogram for malignant neoplasm of breast: Secondary | ICD-10-CM

## 2011-08-23 ENCOUNTER — Other Ambulatory Visit: Payer: Self-pay | Admitting: Gynecology

## 2011-09-25 ENCOUNTER — Ambulatory Visit
Admission: RE | Admit: 2011-09-25 | Discharge: 2011-09-25 | Disposition: A | Discharge status: Home / Self Care | Payer: PRIVATE HEALTH INSURANCE | Source: Ambulatory Visit | Attending: Endocrinology | Admitting: Endocrinology

## 2011-09-25 ENCOUNTER — Other Ambulatory Visit: Payer: Self-pay | Admitting: Endocrinology

## 2011-09-25 DIAGNOSIS — R05 Cough: Secondary | ICD-10-CM

## 2011-12-05 ENCOUNTER — Other Ambulatory Visit: Payer: Self-pay | Admitting: Endocrinology

## 2011-12-05 DIAGNOSIS — E041 Nontoxic single thyroid nodule: Secondary | ICD-10-CM

## 2012-05-26 ENCOUNTER — Ambulatory Visit
Admission: RE | Admit: 2012-05-26 | Discharge: 2012-05-26 | Disposition: A | Discharge status: Home / Self Care | Payer: PRIVATE HEALTH INSURANCE | Source: Ambulatory Visit | Attending: Endocrinology | Admitting: Endocrinology

## 2012-05-26 DIAGNOSIS — E041 Nontoxic single thyroid nodule: Secondary | ICD-10-CM

## 2012-06-02 ENCOUNTER — Other Ambulatory Visit: Payer: PRIVATE HEALTH INSURANCE

## 2012-08-20 ENCOUNTER — Ambulatory Visit
Admission: RE | Admit: 2012-08-20 | Discharge: 2012-08-20 | Disposition: A | Discharge status: Home / Self Care | Payer: PRIVATE HEALTH INSURANCE | Source: Ambulatory Visit | Attending: Gynecology | Admitting: Gynecology

## 2012-08-20 ENCOUNTER — Other Ambulatory Visit: Payer: Self-pay | Admitting: Gynecology

## 2012-08-20 DIAGNOSIS — Z1231 Encounter for screening mammogram for malignant neoplasm of breast: Secondary | ICD-10-CM

## 2012-08-26 ENCOUNTER — Other Ambulatory Visit: Payer: Self-pay | Admitting: Gynecology

## 2012-11-24 ENCOUNTER — Encounter: Payer: Self-pay | Admitting: *Deleted

## 2012-12-06 ENCOUNTER — Encounter: Payer: Self-pay | Admitting: *Deleted

## 2012-12-09 ENCOUNTER — Encounter: Payer: Self-pay | Admitting: Cardiovascular Disease

## 2013-02-20 ENCOUNTER — Ambulatory Visit: Payer: PRIVATE HEALTH INSURANCE | Admitting: Cardiovascular Disease

## 2013-03-11 ENCOUNTER — Other Ambulatory Visit: Payer: Self-pay | Admitting: Cardiovascular Disease

## 2013-03-11 ENCOUNTER — Ambulatory Visit (INDEPENDENT_AMBULATORY_CARE_PROVIDER_SITE_OTHER): Payer: PRIVATE HEALTH INSURANCE | Admitting: Cardiovascular Disease

## 2013-03-11 ENCOUNTER — Encounter: Payer: Self-pay | Admitting: Cardiovascular Disease

## 2013-03-11 VITALS — BP 132/80 | HR 72 | Resp 16 | Ht 61.5 in | Wt 152.5 lb

## 2013-03-11 DIAGNOSIS — Z950 Presence of cardiac pacemaker: Secondary | ICD-10-CM

## 2013-03-11 DIAGNOSIS — E785 Hyperlipidemia, unspecified: Secondary | ICD-10-CM

## 2013-03-11 DIAGNOSIS — R55 Syncope and collapse: Secondary | ICD-10-CM | POA: Insufficient documentation

## 2013-03-11 LAB — PACEMAKER DEVICE OBSERVATION

## 2013-03-11 NOTE — Assessment & Plan Note (Signed)
Dominant cardioinhibitory component, no recurrence since pacemaker implantation

## 2013-03-11 NOTE — Progress Notes (Signed)
Patient ID: Sherry Davis, female   DOB: September 12, 1950, 62 y.o.   MRN: [id number]     Reason for office visit Syncope, pacemaker followup, hyperlipidemia  It has been just over a year since Sherry Davis's last in office evaluation. It has been about 6 months since her last remote Care[facility] pacemaker check. During this time the been no new challenges to her health. She has not had any complaints of syncope or presyncope and denies palpitations.    Allergies  Allergen Reactions  . Penicillins     Current Outpatient Prescriptions  Medication Sig Dispense Refill  . aspirin 81 MG tablet Take 81 mg by mouth 2 (two) times daily.      Marland Kitchen ezetimibe (ZETIA) 10 MG tablet Take 10 mg by mouth daily.      . rosuvastatin (CRESTOR) 20 MG tablet Take 20 mg by mouth daily.       No current facility-administered medications for this visit.    Past Medical History  Diagnosis Date  . Syncope   . Sinoatrial arrest with nodal/ventricular escape   . 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 Dyslipidemia   . GERD (gastroesophageal reflux disease)     Past Surgical History  Procedure Laterality Date  . Breast lumpectomy    . Dilation and curettage of uterus    . Permanent pacemaker insertion  08/17/2008    Medtronic adapta    Family History  Problem Relation Age of Onset  . Hyperlipidemia Mother   . Hypertension Mother   . Hyperlipidemia Father   . Hypertension Brother     History   Social History  . Marital Status: Married    Spouse Name: N/A    Number of Children: N/A  . Years of Education: N/A   Occupational History  . Not on file.   Social History Main Topics  . Smoking status: Former Smoker    Types: Cigarettes  . Smokeless tobacco: Former Neurosurgeon    Quit date: 08/05/1984  . Alcohol Use: No  . Drug Use: No  . Sexually Active: Not on file   Other Topics Concern  . Not on file   Social History Narrative  . No narrative on file    Review  of systems: The patient specifically denies any chest pain at rest or with exertion, dyspnea at rest or with exertion, orthopnea, paroxysmal nocturnal dyspnea, syncope, palpitations, focal neurological deficits, intermittent claudication, lower extremity edema, unexplained weight gain, cough, hemoptysis or wheezing.  The patient also denies abdominal pain, nausea, vomiting, dysphagia, diarrhea, constipation, polyuria, polydipsia, dysuria, hematuria, frequency, urgency, abnormal bleeding or bruising, fever, chills, unexpected weight changes, mood swings, change in skin or hair texture, change in voice quality, auditory or visual problems, allergic reactions or rashes, new musculoskeletal complaints other than usual "aches and pains".   PHYSICAL EXAM BP 132/80  Pulse 72  Resp 16  Ht 5' 1.5" (1.562 m)  Wt 152 lb 8 oz (69.174 kg)  BMI 28.35 kg/m2  General: Alert, oriented x3, no distress Head: no evidence of trauma, PERRL, EOMI, no exophtalmos or lid lag, no myxedema, no xanthelasma; normal ears, nose and oropharynx Neck: normal jugular venous pulsations and no hepatojugular reflux; brisk carotid pulses without delay and no carotid bruits Chest: clear to auscultation, no signs of consolidation by percussion or palpation, normal fremitus, symmetrical and full respiratory excursions Cardiovascular: normal position and quality of the apical impulse, regular rhythm, normal first and second heart sounds, no  murmurs, rubs or gallops Abdomen: no tenderness or distention, no masses by palpation, no abnormal pulsatility or arterial bruits, normal bowel sounds, no hepatosplenomegaly Extremities: no clubbing, cyanosis or edema; 2+ radial, ulnar and brachial pulses bilaterally; 2+ right femoral, posterior tibial and dorsalis pedis pulses; 2+ left femoral, posterior tibial and dorsalis pedis pulses; no subclavian or femoral bruits Neurological: grossly nonfocal   EKG: Sinus rhythm, normal tracing  Lipid  Panel / BMET April 2013 total cholesterol 125 triglycerides 107 HDL 39, LDL 65, normal TSH, normal liver function tests, creatinine 0.9, potassium 4.0      ASSESSMENT AND PLAN Pacemaker-MDT dual ch, impl 2010. Normal device function. Full interrogation performed by me today. Battery voltage is 2.79 V with S1 longevity of roughly 12 years. No meaningful episodes of mode switch recorded. Presenting rhythm is sinus rhythm. Normal heart rate histogram distribution. Atrial pacing occurs 8.5% of the time and there is virtually no ventricular pacing. Sensed P waves 2.0-2.8 mV, atrial threshold 0.5 V at 0 point from a seconds pulse width, impedance 449 ohms. Ventricular sensed R waves 15.68-22.4 mV, threshold 0.75 V at 0.4 seconds pulse width, impedance 583 ohms. No permanent changes in the device settings. She is programmed AAIR-DDD R. with a lower rate of 60 beats per minute and upper tracking and sensor rates of 130 beats per minute.  Hyperlipidemia Favorable lipid parameters on recent testing  Neurocardiogenic syncope Dominant cardioinhibitory component, no recurrence since pacemaker implantation   Orders Placed This Encounter  Procedures  . EKG 12-Lead   No orders of the defined types were placed in this encounter.    Junious Silk, MD, [facility] [phone] office [phone] pager

## 2013-03-11 NOTE — Assessment & Plan Note (Signed)
Normal device function. Full interrogation performed by me today. Battery voltage is 2.79 V with S1 longevity of roughly 12 years. No meaningful episodes of mode switch recorded. Presenting rhythm is sinus rhythm. Normal heart rate histogram distribution. Atrial pacing occurs 8.5% of the time and there is virtually no ventricular pacing. Sensed P waves 2.0-2.8 mV, atrial threshold 0.5 V at 0 point from a seconds pulse width, impedance 449 ohms. Ventricular sensed R waves 15.68-22.4 mV, threshold 0.75 V at 0.4 seconds pulse width, impedance 583 ohms. No permanent changes in the device settings. She is programmed AAIR-DDD R. with a lower rate of 60 beats per minute and upper tracking and sensor rates of 130 beats per minute.

## 2013-03-11 NOTE — Assessment & Plan Note (Signed)
Favorable lipid parameters on recent testing

## 2013-03-11 NOTE — Patient Instructions (Addendum)
Your physician recommends that you schedule a follow-up appointment in: One year.  

## 2013-03-12 ENCOUNTER — Ambulatory Visit: Payer: PRIVATE HEALTH INSURANCE | Admitting: Cardiovascular Disease

## 2013-04-16 LAB — PACEMAKER DEVICE OBSERVATION
AL AMPLITUDE: 2 mv
AL THRESHOLD: 0.75 V
RV LEAD IMPEDENCE PM: 583 Ohm
RV LEAD THRESHOLD: 0.5 V
VENTRICULAR PACING PM: 0.3

## 2013-06-11 ENCOUNTER — Other Ambulatory Visit: Payer: Self-pay | Admitting: Endocrinology

## 2013-06-11 DIAGNOSIS — E041 Nontoxic single thyroid nodule: Secondary | ICD-10-CM

## 2013-06-15 ENCOUNTER — Ambulatory Visit (INDEPENDENT_AMBULATORY_CARE_PROVIDER_SITE_OTHER): Payer: PRIVATE HEALTH INSURANCE

## 2013-06-15 DIAGNOSIS — I495 Sick sinus syndrome: Secondary | ICD-10-CM

## 2013-06-15 LAB — MDC_IDC_ENUM_SESS_TYPE_REMOTE
Battery Remaining Longevity: 12
Battery Voltage: 2.79 V
Brady Statistic AS VP Percent: 0.2 %
Lead Channel Sensing Intrinsic Amplitude: 16 mV
Lead Channel Sensing Intrinsic Amplitude: 2.8 mV
Lead Channel Setting Pacing Amplitude: 1.5 V
Lead Channel Setting Pacing Pulse Width: 0.4 ms

## 2013-06-15 LAB — PACEMAKER DEVICE OBSERVATION

## 2013-06-26 ENCOUNTER — Encounter: Payer: Self-pay | Admitting: *Deleted

## 2013-08-25 ENCOUNTER — Other Ambulatory Visit: Payer: Self-pay

## 2013-08-25 DIAGNOSIS — Z1231 Encounter for screening mammogram for malignant neoplasm of breast: Secondary | ICD-10-CM

## 2013-08-28 ENCOUNTER — Ambulatory Visit
Admission: RE | Admit: 2013-08-28 | Discharge: 2013-08-28 | Disposition: A | Discharge status: Home / Self Care | Payer: PRIVATE HEALTH INSURANCE | Source: Ambulatory Visit

## 2013-08-28 DIAGNOSIS — Z1231 Encounter for screening mammogram for malignant neoplasm of breast: Secondary | ICD-10-CM

## 2013-09-17 ENCOUNTER — Ambulatory Visit (INDEPENDENT_AMBULATORY_CARE_PROVIDER_SITE_OTHER): Payer: PRIVATE HEALTH INSURANCE | Admitting: *Deleted

## 2013-09-17 DIAGNOSIS — I495 Sick sinus syndrome: Secondary | ICD-10-CM

## 2013-09-23 ENCOUNTER — Encounter: Payer: Self-pay | Admitting: *Deleted

## 2013-09-24 LAB — MDC_IDC_ENUM_SESS_TYPE_REMOTE
Battery Impedance: 202 Ohm
Battery Voltage: 2.79 V
Brady Statistic AP VP Percent: 0 %
Brady Statistic AP VS Percent: 8 %
Brady Statistic AS VP Percent: 0 %
Date Time Interrogation Session: 20150216020145
Lead Channel Impedance Value: 431 Ohm
Lead Channel Pacing Threshold Amplitude: 0.375 V
Lead Channel Pacing Threshold Pulse Width: 0.4 ms
Lead Channel Sensing Intrinsic Amplitude: 2.8 mV
Lead Channel Setting Pacing Amplitude: 2 V
MDC IDC MSMT BATTERY REMAINING LONGEVITY: 145 mo
MDC IDC MSMT LEADCHNL RV IMPEDANCE VALUE: 505 Ohm
MDC IDC MSMT LEADCHNL RV PACING THRESHOLD AMPLITUDE: 0.75 V
MDC IDC MSMT LEADCHNL RV PACING THRESHOLD PULSEWIDTH: 0.4 ms
MDC IDC MSMT LEADCHNL RV SENSING INTR AMPL: 16 mV
MDC IDC SET LEADCHNL RA PACING AMPLITUDE: 1.5 V
MDC IDC SET LEADCHNL RV PACING PULSEWIDTH: 0.4 ms
MDC IDC SET LEADCHNL RV SENSING SENSITIVITY: 5.6 mV
MDC IDC STAT BRADY AS VS PERCENT: 91 %

## 2013-10-13 ENCOUNTER — Encounter: Payer: Self-pay | Admitting: *Deleted

## 2013-10-20 ENCOUNTER — Encounter: Payer: Self-pay | Admitting: Cardiovascular Disease

## 2013-12-03 ENCOUNTER — Ambulatory Visit
Admission: RE | Admit: 2013-12-03 | Discharge: 2013-12-03 | Disposition: A | Discharge status: Home / Self Care | Payer: PRIVATE HEALTH INSURANCE | Source: Ambulatory Visit | Attending: Endocrinology | Admitting: Endocrinology

## 2013-12-03 DIAGNOSIS — E041 Nontoxic single thyroid nodule: Secondary | ICD-10-CM

## 2013-12-04 ENCOUNTER — Other Ambulatory Visit: Payer: PRIVATE HEALTH INSURANCE

## 2013-12-22 ENCOUNTER — Ambulatory Visit (INDEPENDENT_AMBULATORY_CARE_PROVIDER_SITE_OTHER): Payer: PRIVATE HEALTH INSURANCE | Admitting: *Deleted

## 2013-12-22 ENCOUNTER — Telehealth: Payer: Self-pay | Admitting: Cardiology

## 2013-12-22 ENCOUNTER — Encounter: Payer: Self-pay | Admitting: Cardiovascular Disease

## 2013-12-22 DIAGNOSIS — I495 Sick sinus syndrome: Secondary | ICD-10-CM

## 2013-12-22 NOTE — Telephone Encounter (Signed)
LMOVM reminding pt to send remote transmission.   

## 2013-12-22 NOTE — Progress Notes (Signed)
Remote pacemaker transmission.   

## 2013-12-24 ENCOUNTER — Encounter: Payer: Self-pay | Admitting: Cardiovascular Disease

## 2013-12-24 LAB — MDC_IDC_ENUM_SESS_TYPE_REMOTE
Battery Remaining Longevity: 141 mo
Battery Voltage: 2.79 V
Brady Statistic AP VS Percent: 9 %
Lead Channel Pacing Threshold Amplitude: 0.75 V
Lead Channel Pacing Threshold Pulse Width: 0.4 ms
Lead Channel Pacing Threshold Pulse Width: 0.4 ms
Lead Channel Sensing Intrinsic Amplitude: 16 mV
Lead Channel Setting Pacing Amplitude: 2 V
Lead Channel Setting Pacing Pulse Width: 0.4 ms
Lead Channel Setting Sensing Sensitivity: 5.6 mV
MDC IDC MSMT BATTERY IMPEDANCE: 225 Ohm
MDC IDC MSMT LEADCHNL RA IMPEDANCE VALUE: 425 Ohm
MDC IDC MSMT LEADCHNL RA PACING THRESHOLD AMPLITUDE: 0.375 V
MDC IDC MSMT LEADCHNL RA SENSING INTR AMPL: 2.8 mV
MDC IDC MSMT LEADCHNL RV IMPEDANCE VALUE: 516 Ohm
MDC IDC SESS DTM: 20150520004817
MDC IDC SET LEADCHNL RA PACING AMPLITUDE: 1.5 V
MDC IDC STAT BRADY AP VP PERCENT: 0 %
MDC IDC STAT BRADY AS VP PERCENT: 0 %
MDC IDC STAT BRADY AS VS PERCENT: 91 %

## 2013-12-25 ENCOUNTER — Encounter: Payer: Self-pay | Admitting: Cardiovascular Disease

## 2014-01-04 ENCOUNTER — Encounter: Payer: Self-pay | Admitting: Cardiology

## 2014-03-17 ENCOUNTER — Telehealth: Payer: Self-pay | Admitting: Cardiovascular Disease

## 2014-03-17 NOTE — Telephone Encounter (Signed)
Pt wanting [name] to give her a call to discuss some things in regards to her upcoming appts. Please call  Thanks

## 2014-03-17 NOTE — Telephone Encounter (Signed)
Forwarded message to [employer]

## 2014-03-19 NOTE — Telephone Encounter (Signed)
Close encounter 

## 2014-03-23 ENCOUNTER — Ambulatory Visit (INDEPENDENT_AMBULATORY_CARE_PROVIDER_SITE_OTHER): Payer: PRIVATE HEALTH INSURANCE | Admitting: Cardiovascular Disease

## 2014-03-23 ENCOUNTER — Encounter: Payer: Self-pay | Admitting: Cardiovascular Disease

## 2014-03-23 VITALS — BP 130/70 | HR 76 | Resp 16 | Ht 61.5 in | Wt 151.5 lb

## 2014-03-23 DIAGNOSIS — Z950 Presence of cardiac pacemaker: Secondary | ICD-10-CM

## 2014-03-23 DIAGNOSIS — R55 Syncope and collapse: Secondary | ICD-10-CM

## 2014-03-23 DIAGNOSIS — E785 Hyperlipidemia, unspecified: Secondary | ICD-10-CM

## 2014-03-23 NOTE — Patient Instructions (Signed)
Remote monitoring is used to monitor your pacemaker from home. This monitoring reduces the number of office visits required to check your device to one time per year. It allows [place] to keep an eye on the functioning of your device to ensure it is working properly. You are scheduled for a device check from home on 06-24-2014. You may send your transmission at any time that day. If you have a wireless device, the transmission will be sent automatically. After your physician reviews your transmission, you will receive a postcard with your next transmission date.  Your physician recommends that you schedule a follow-up appointment in: 12 months with Dr.Croitoru

## 2014-03-24 LAB — MDC_IDC_ENUM_SESS_TYPE_INCLINIC
Battery Impedance: 225 Ohm
Battery Remaining Longevity: 141 mo
Battery Voltage: 2.79 V
Brady Statistic AP VP Percent: 0 %
Brady Statistic AP VS Percent: 9 %
Brady Statistic AS VP Percent: 0 %
Date Time Interrogation Session: 20150818152957
Lead Channel Pacing Threshold Amplitude: 0.5 V
Lead Channel Pacing Threshold Amplitude: 0.5 V
Lead Channel Pacing Threshold Pulse Width: 0.4 ms
Lead Channel Sensing Intrinsic Amplitude: 4 mV
Lead Channel Setting Pacing Amplitude: 2 V
Lead Channel Setting Pacing Pulse Width: 0.4 ms
MDC IDC MSMT LEADCHNL RA IMPEDANCE VALUE: 438 Ohm
MDC IDC MSMT LEADCHNL RV IMPEDANCE VALUE: 501 Ohm
MDC IDC MSMT LEADCHNL RV PACING THRESHOLD PULSEWIDTH: 0.4 ms
MDC IDC MSMT LEADCHNL RV SENSING INTR AMPL: 15.67 mV
MDC IDC SET LEADCHNL RA PACING AMPLITUDE: 1.5 V
MDC IDC SET LEADCHNL RV SENSING SENSITIVITY: 5.6 mV
MDC IDC STAT BRADY AS VS PERCENT: 91 %

## 2014-03-25 ENCOUNTER — Encounter: Payer: Self-pay | Admitting: Cardiovascular Disease

## 2014-03-25 NOTE — Progress Notes (Signed)
Patient ID: Sherry Davis, female   DOB: [birth year], 63 y.o.   MRN: [id number]      Reason for office visit Syncope, pacemaker followup, hyperlipidemia    Sherry Davis is a 63 year old woman with a history of neurocardiogenic syncope with important cardioinhibitory component who has been free of syncope since pacemaker implantation in 2010. She has had a few episodes of presyncopal prodrome, but never had visual disturbances or a feeling of loss of consciousness. She did not have to drop her activities when this happened.  Interrogation of her Medtronic dual-chamber pacemaker shows normal device function. She'll need paces the atrium roughly 90% of the time and virtually never paces the ventricle. The device has not recorded any abnormal rhythms. She has normal heart rate histogram distribution. She has no other cardiac complaints. Estimated battery longevity is around 11 years.   Allergies  Allergen Reactions  . Penicillins     Current Outpatient Prescriptions  Medication Sig Dispense Refill  . aspirin 81 MG tablet Take 81 mg by mouth 2 (two) times daily.      Marland Kitchen ezetimibe (ZETIA) 10 MG tablet Take 10 mg by mouth daily.      . rosuvastatin (CRESTOR) 20 MG tablet Take 20 mg by mouth daily.       No current facility-administered medications for this visit.    Past Medical History  Diagnosis Date  . Syncope   . Sinoatrial arrest with nodal/ventricular escape   . 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 Dyslipidemia   . GERD (gastroesophageal reflux disease)     Past Surgical History  Procedure Laterality Date  . Breast lumpectomy    . Dilation and curettage of uterus    . Permanent pacemaker insertion  08/17/2008    Medtronic adapta    Family History  Problem Relation Age of Onset  . Hyperlipidemia Mother   . Hypertension Mother   . Hyperlipidemia Father   . Hypertension Brother     History   Social History  . Marital Status:  Married    Spouse Name: N/A    Number of Children: N/A  . Years of Education: N/A   Occupational History  . Not on file.   Social History Main Topics  . Smoking status: Former Smoker    Types: Cigarettes  . Smokeless tobacco: Never Used  . Alcohol Use: No  . Drug Use: No  . Sexual Activity: Not on file   Other Topics Concern  . Not on file   Social History Narrative  . No narrative on file    Review of systems: The patient specifically denies any chest pain at rest or with exertion, dyspnea at rest or with exertion, orthopnea, paroxysmal nocturnal dyspnea, syncope, palpitations, focal neurological deficits, intermittent claudication, lower extremity edema, unexplained weight gain, cough, hemoptysis or wheezing.  The patient also denies abdominal pain, nausea, vomiting, dysphagia, diarrhea, constipation, polyuria, polydipsia, dysuria, hematuria, frequency, urgency, abnormal bleeding or bruising, fever, chills, unexpected weight changes, mood swings, change in skin or hair texture, change in voice quality, auditory or visual problems, allergic reactions or rashes, new musculoskeletal complaints other than usual "aches and pains".   PHYSICAL EXAM BP 130/70  Pulse 76  Resp 16  Ht 5' 1.5" (1.562 m)  Wt 151 lb 8 oz (68.72 kg)  BMI 28.17 kg/m2  General: Alert, oriented x3, no distress Head: no evidence of trauma, PERRL, EOMI, no exophtalmos or lid lag, no myxedema, no xanthelasma; normal  ears, nose and oropharynx Neck: normal jugular venous pulsations and no hepatojugular reflux; brisk carotid pulses without delay and no carotid bruits Chest: clear to auscultation, no signs of consolidation by percussion or palpation, normal fremitus, symmetrical and full respiratory excursions Cardiovascular: normal position and quality of the apical impulse, regular rhythm, normal first and second heart sounds, no murmurs, rubs or gallops Abdomen: no tenderness or distention, no masses by  palpation, no abnormal pulsatility or arterial bruits, normal bowel sounds, no hepatosplenomegaly Extremities: no clubbing, cyanosis or edema; 2+ radial, ulnar and brachial pulses bilaterally; 2+ right femoral, posterior tibial and dorsalis pedis pulses; 2+ left femoral, posterior tibial and dorsalis pedis pulses; no subclavian or femoral bruits Neurological: grossly nonfocal   EKG: Normal sinus rhythm, normal tracing  Lipid Panel     Component Value Date/Time   CHOL  Value: 138        ATP III CLASSIFICATION:  <200     mg/dL   Desirable  200-239  mg/dL   Borderline High  >=240    mg/dL   High        08/16/2008 0220   TRIG 72 08/16/2008 0220   HDL 36* 08/16/2008 0220   CHOLHDL 3.8 08/16/2008 0220   VLDL 14 08/16/2008 0220   LDLCALC  Value: 88        Total Cholesterol/HDL:CHD Risk Coronary Heart Disease Risk Table                     Men   Women  1/2 Average Risk   3.4   3.3  Average Risk       5.0   4.4  2 X Average Risk   9.6   7.1  3 X Average Risk  23.4   11.0        Use the calculated Patient Ratio above and the CHD Risk Table to determine the patient's CHD Risk.        ATP III CLASSIFICATION (LDL):  <100     mg/dL   Optimal  100-129  mg/dL   Near or Above                    Optimal  130-159  mg/dL   Borderline  160-189  mg/dL   High  >190     mg/dL   Very High 08/16/2008 0220    BMET    Component Value Date/Time   NA 138 08/17/2008 0520   K 3.4* 08/18/2008 0435   CL 106 08/17/2008 0520   CO2 25 08/17/2008 0520   GLUCOSE 111* 08/17/2008 0520   BUN 3* 08/17/2008 0520   CREATININE 0.80 08/17/2008 0520   CALCIUM 9.1 08/17/2008 0520   GFRNONAA >60 08/17/2008 0520   GFRAA  Value: >60        The eGFR has been calculated using the MDRD equation. This calculation has not been validated in all clinical situations. eGFR's persistently <60 mL/min signify possible Chronic Kidney Disease. 08/17/2008 0520     ASSESSMENT AND PLAN  Sherry Davis has had successful treatment of her neurocardiogenic syncope  with pacemaker implantation. The occasional prodromal events that she experiences may be accompanied by a vasopressor response and could conceivably still be too presyncope/syncope. No specific therapy appears to be necessary at this time, she is encouraged however to remain very well hydrated and eat a relatively high sodium diet. No pacemaker programming changes are necessary today. Will retrieve her lipid profile from her primary care physician.  Patient Instructions  Remote monitoring is used to monitor your pacemaker from home. This monitoring reduces the number of office visits required to check your device to one time per year. It allows [place] to keep an eye on the functioning of your device to ensure it is working properly. You are scheduled for a device check from home on 06-24-2014. You may send your transmission at any time that day. If you have a wireless device, the transmission will be sent automatically. After your physician reviews your transmission, you will receive a postcard with your next transmission date.  Your physician recommends that you schedule a follow-up appointment in: 12 months with Dr.Arber Wiemers       Orders Placed This Encounter  Procedures  . Implantable device check  . EKG 12-Lead   No orders of the defined types were placed in this encounter.    Holli Humbles, MD, [place] [phone] office [phone] pager

## 2014-04-19 ENCOUNTER — Encounter: Payer: Self-pay | Admitting: Cardiology

## 2014-05-20 ENCOUNTER — Ambulatory Visit: Payer: PRIVATE HEALTH INSURANCE | Admitting: Cardiovascular Disease

## 2014-06-12 ENCOUNTER — Encounter: Payer: Self-pay | Admitting: Cardiovascular Disease

## 2014-06-24 ENCOUNTER — Ambulatory Visit (INDEPENDENT_AMBULATORY_CARE_PROVIDER_SITE_OTHER): Payer: PRIVATE HEALTH INSURANCE | Admitting: *Deleted

## 2014-06-24 ENCOUNTER — Telehealth: Payer: Self-pay | Admitting: Cardiology

## 2014-06-24 DIAGNOSIS — I495 Sick sinus syndrome: Secondary | ICD-10-CM

## 2014-06-24 NOTE — Telephone Encounter (Signed)
LMOVM reminding pt to send remote transmission.   

## 2014-06-25 LAB — MDC_IDC_ENUM_SESS_TYPE_REMOTE
Battery Remaining Longevity: 139 mo
Battery Voltage: 2.79 V
Brady Statistic AS VP Percent: 0 %
Lead Channel Pacing Threshold Amplitude: 0.375 V
Lead Channel Pacing Threshold Amplitude: 0.625 V
Lead Channel Pacing Threshold Pulse Width: 0.4 ms
Lead Channel Sensing Intrinsic Amplitude: 16 mV
Lead Channel Setting Pacing Amplitude: 1.5 V
Lead Channel Setting Pacing Amplitude: 2 V
Lead Channel Setting Pacing Pulse Width: 0.4 ms
Lead Channel Setting Sensing Sensitivity: 5.6 mV
MDC IDC MSMT BATTERY IMPEDANCE: 249 Ohm
MDC IDC MSMT LEADCHNL RA IMPEDANCE VALUE: 442 Ohm
MDC IDC MSMT LEADCHNL RA PACING THRESHOLD PULSEWIDTH: 0.4 ms
MDC IDC MSMT LEADCHNL RA SENSING INTR AMPL: 2.8 mV
MDC IDC MSMT LEADCHNL RV IMPEDANCE VALUE: 563 Ohm
MDC IDC SESS DTM: 20151120005237
MDC IDC STAT BRADY AP VP PERCENT: 0 %
MDC IDC STAT BRADY AP VS PERCENT: 0 %
MDC IDC STAT BRADY AS VS PERCENT: 99 %

## 2014-06-25 NOTE — Progress Notes (Signed)
Remote pacemaker transmission.   

## 2014-06-29 ENCOUNTER — Encounter: Payer: Self-pay | Admitting: Cardiology

## 2014-07-16 ENCOUNTER — Encounter: Payer: Self-pay | Admitting: Cardiovascular Disease

## 2014-08-14 ENCOUNTER — Telehealth: Payer: Self-pay | Admitting: Nurse Practitioner

## 2014-08-14 DIAGNOSIS — N3000 Acute cystitis without hematuria: Secondary | ICD-10-CM

## 2014-08-14 MED ORDER — SULFAMETHOXAZOLE-TRIMETHOPRIM 800-160 MG PO TABS: 1.0000 | ORAL_TABLET | Freq: Two times a day (BID) | ORAL | Status: DC

## 2014-08-14 NOTE — Progress Notes (Signed)

## 2014-09-08 ENCOUNTER — Ambulatory Visit
Admission: RE | Admit: 2014-09-08 | Discharge: 2014-09-08 | Disposition: A | Discharge status: Home / Self Care | Payer: PRIVATE HEALTH INSURANCE | Source: Ambulatory Visit

## 2014-09-08 ENCOUNTER — Other Ambulatory Visit: Payer: Self-pay

## 2014-09-08 DIAGNOSIS — Z1231 Encounter for screening mammogram for malignant neoplasm of breast: Secondary | ICD-10-CM

## 2014-09-27 ENCOUNTER — Ambulatory Visit (INDEPENDENT_AMBULATORY_CARE_PROVIDER_SITE_OTHER): Payer: PRIVATE HEALTH INSURANCE | Admitting: *Deleted

## 2014-09-27 ENCOUNTER — Telehealth: Payer: Self-pay | Admitting: Cardiology

## 2014-09-27 DIAGNOSIS — I495 Sick sinus syndrome: Secondary | ICD-10-CM

## 2014-09-27 NOTE — Telephone Encounter (Signed)
Confirmed remote transmission w/ pt husband.   

## 2014-09-28 LAB — MDC_IDC_ENUM_SESS_TYPE_REMOTE
Battery Impedance: 249 Ohm
Brady Statistic AP VP Percent: 0 %
Brady Statistic AS VS Percent: 99 %
Date Time Interrogation Session: 20160223013359
Lead Channel Impedance Value: 425 Ohm
Lead Channel Impedance Value: 527 Ohm
Lead Channel Pacing Threshold Pulse Width: 0.4 ms
Lead Channel Sensing Intrinsic Amplitude: 1.4 mV
Lead Channel Setting Pacing Amplitude: 1.5 V
MDC IDC MSMT BATTERY REMAINING LONGEVITY: 139 mo
MDC IDC MSMT BATTERY VOLTAGE: 2.79 V
MDC IDC MSMT LEADCHNL RA PACING THRESHOLD AMPLITUDE: 0.375 V
MDC IDC MSMT LEADCHNL RV PACING THRESHOLD AMPLITUDE: 0.75 V
MDC IDC MSMT LEADCHNL RV PACING THRESHOLD PULSEWIDTH: 0.4 ms
MDC IDC MSMT LEADCHNL RV SENSING INTR AMPL: 16 mV
MDC IDC SET LEADCHNL RV PACING AMPLITUDE: 2 V
MDC IDC SET LEADCHNL RV PACING PULSEWIDTH: 0.4 ms
MDC IDC SET LEADCHNL RV SENSING SENSITIVITY: 5.6 mV
MDC IDC STAT BRADY AP VS PERCENT: 0 %
MDC IDC STAT BRADY AS VP PERCENT: 0 %

## 2014-09-28 NOTE — Progress Notes (Signed)
Remote pacemaker transmission.   

## 2014-10-08 ENCOUNTER — Encounter: Payer: Self-pay | Admitting: Cardiology

## 2014-10-13 ENCOUNTER — Encounter: Payer: Self-pay | Admitting: Cardiovascular Disease

## 2014-11-29 ENCOUNTER — Other Ambulatory Visit: Payer: Self-pay | Admitting: Endocrinology

## 2014-11-29 ENCOUNTER — Ambulatory Visit
Admission: RE | Admit: 2014-11-29 | Discharge: 2014-11-29 | Disposition: A | Discharge status: Home / Self Care | Payer: PRIVATE HEALTH INSURANCE | Source: Ambulatory Visit | Attending: Endocrinology | Admitting: Endocrinology

## 2014-11-29 DIAGNOSIS — R042 Hemoptysis: Secondary | ICD-10-CM

## 2014-12-29 ENCOUNTER — Ambulatory Visit (INDEPENDENT_AMBULATORY_CARE_PROVIDER_SITE_OTHER): Payer: PRIVATE HEALTH INSURANCE | Admitting: *Deleted

## 2014-12-29 ENCOUNTER — Telehealth: Payer: Self-pay | Admitting: Cardiology

## 2014-12-29 DIAGNOSIS — I495 Sick sinus syndrome: Secondary | ICD-10-CM

## 2014-12-29 NOTE — Telephone Encounter (Signed)
Confirmed remote transmission w/ pt husband.   

## 2014-12-30 NOTE — Progress Notes (Signed)
Remote pacemaker transmission.   

## 2014-12-31 LAB — CUP PACEART REMOTE DEVICE CHECK
Battery Impedance: 272 Ohm
Brady Statistic AS VP Percent: 0 %
Date Time Interrogation Session: 20160525205223
Lead Channel Pacing Threshold Pulse Width: 0.4 ms
Lead Channel Sensing Intrinsic Amplitude: 16 mV
Lead Channel Setting Pacing Amplitude: 1.5 V
Lead Channel Setting Pacing Pulse Width: 0.4 ms
Lead Channel Setting Sensing Sensitivity: 5.6 mV
MDC IDC MSMT BATTERY REMAINING LONGEVITY: 135 mo
MDC IDC MSMT BATTERY VOLTAGE: 2.79 V
MDC IDC MSMT LEADCHNL RA IMPEDANCE VALUE: 419 Ohm
MDC IDC MSMT LEADCHNL RA PACING THRESHOLD AMPLITUDE: 0.375 V
MDC IDC MSMT LEADCHNL RA PACING THRESHOLD PULSEWIDTH: 0.4 ms
MDC IDC MSMT LEADCHNL RA SENSING INTR AMPL: 2.8 mV
MDC IDC MSMT LEADCHNL RV IMPEDANCE VALUE: 493 Ohm
MDC IDC MSMT LEADCHNL RV PACING THRESHOLD AMPLITUDE: 0.75 V
MDC IDC SET LEADCHNL RV PACING AMPLITUDE: 2 V
MDC IDC STAT BRADY AP VP PERCENT: 0 %
MDC IDC STAT BRADY AP VS PERCENT: 0 %
MDC IDC STAT BRADY AS VS PERCENT: 99 %

## 2015-01-07 ENCOUNTER — Encounter: Payer: Self-pay | Admitting: Cardiology

## 2015-01-13 ENCOUNTER — Encounter: Payer: Self-pay | Admitting: Cardiovascular Disease

## 2015-02-21 ENCOUNTER — Encounter: Payer: Self-pay | Admitting: Cardiovascular Disease

## 2015-02-23 HISTORY — PX: COLONOSCOPY: SHX174

## 2015-03-22 ENCOUNTER — Encounter: Payer: Self-pay | Admitting: Cardiovascular Disease

## 2015-04-05 ENCOUNTER — Encounter: Payer: Self-pay | Admitting: Cardiovascular Disease

## 2015-06-10 ENCOUNTER — Encounter: Payer: Self-pay | Admitting: Cardiovascular Disease

## 2015-06-14 ENCOUNTER — Encounter: Payer: PRIVATE HEALTH INSURANCE | Admitting: Cardiovascular Disease

## 2015-06-20 ENCOUNTER — Encounter: Payer: Self-pay | Admitting: Cardiovascular Disease

## 2015-06-20 DIAGNOSIS — I469 Cardiac arrest, cause unspecified: Secondary | ICD-10-CM | POA: Insufficient documentation

## 2015-06-20 NOTE — Progress Notes (Signed)
Patient ID: Sherry Davis, female   DOB: 01-29-1951, 64 y.o.   MRN: [id number]     Cardiology Office Note   Date:  06/21/2015   ID:  Sherry Davis, DOB 02-02-1951, MRN [id number]  PCP:  Dwan Bolt, MD  Cardiologist:   Sanda Klein, MD   No chief complaint on file.     History of Present Illness: Sherry Davis is a 64 y.o. female who presents for Syncope, pacemaker followup, hyperlipidemia    She has a history of neurocardiogenic syncope with important cardioinhibitory component who has been free of syncope since pacemaker implantation in 2010. She has occasional presyncopal prodrome, but never a feeling of loss of consciousness. She does not have to stop her activities when this happens.  Interrogation of her Medtronic dual-chamber pacemaker shows normal device function. She paces the atrium roughly 0.5% of the time and the ventricle 0.3%. The device has not recorded any abnormal rhythms other than very brief bursts of paroxysmal atrial tachycardia without corresponding electrograms. She has normal heart rate histogram distribution. She has no other cardiac complaints. Estimated battery longevity is around 11 years.    Past Medical History  Diagnosis Date  . Syncope   . Sinoatrial arrest with nodal/ventricular escape ([place])   . 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 Dyslipidemia   . GERD (gastroesophageal reflux disease)     Past Surgical History  Procedure Laterality Date  . Breast lumpectomy    . Dilation and curettage of uterus    . Permanent pacemaker insertion  08/17/2008    Medtronic adapta  . Colonoscopy  02/23/2015     Current Outpatient Prescriptions  Medication Sig Dispense Refill  . aspirin 81 MG tablet Take 81 mg by mouth 2 (two) times daily.    Marland Kitchen ezetimibe (ZETIA) 10 MG tablet Take 10 mg by mouth daily.    . rosuvastatin (CRESTOR) 20 MG tablet Take 20 mg by mouth daily.     No current  facility-administered medications for this visit.    Allergies:   Penicillins    Social History:  The patient  reports that she has quit smoking. Her smoking use included Cigarettes. She has never used smokeless tobacco. She reports that she does not drink alcohol or use illicit drugs.   Family History:  The patient's family history includes Hyperlipidemia in her father and mother; Hypertension in her brother and mother.    ROS:  Please see the history of present illness.    Otherwise, review of systems positive for none.   All other systems are reviewed and negative.    PHYSICAL EXAM: VS:  BP 138/78 mmHg  Pulse 74  Ht 5' 1.5" (1.562 m)  Wt 154 lb 11.2 oz (70.171 kg)  BMI 28.76 kg/m2 , BMI Body mass index is 28.76 kg/(m^2).  General: Alert, oriented x3, no distress Head: no evidence of trauma, PERRL, EOMI, no exophtalmos or lid lag, no myxedema, no xanthelasma; normal ears, nose and oropharynx Neck: normal jugular venous pulsations and no hepatojugular reflux; brisk carotid pulses without delay and no carotid bruits Chest: clear to auscultation, no signs of consolidation by percussion or palpation, normal fremitus, symmetrical and full respiratory excursions,healthy subclavian pacemaker site Cardiovascular: normal position and quality of the apical impulse, regular rhythm, normal first and second heart sounds, no murmurs, rubs or gallops Abdomen: no tenderness or distention, no masses by palpation, no abnormal pulsatility or arterial bruits, normal bowel sounds, no hepatosplenomegaly Extremities: no  clubbing, cyanosis or edema; 2+ radial, ulnar and brachial pulses bilaterally; 2+ right femoral, posterior tibial and dorsalis pedis pulses; 2+ left femoral, posterior tibial and dorsalis pedis pulses; no subclavian or femoral bruits Neurological: grossly nonfocal Psych: euthymic mood, full affect   EKG:  EKG is ordered today. The ekg ordered today demonstrates NSR   Recent Labs: No  results found for requested labs within last 365 days.    Lipid Panel    Component Value Date/Time   CHOL  08/16/2008 0220    138        ATP III CLASSIFICATION:  <200     mg/dL   Desirable  200-239  mg/dL   Borderline High  >=240    mg/dL   High          TRIG 72 08/16/2008 0220   HDL 36* 08/16/2008 0220   CHOLHDL 3.8 08/16/2008 0220   VLDL 14 08/16/2008 0220   LDLCALC  08/16/2008 0220    88        Total Cholesterol/HDL:CHD Risk Coronary Heart Disease Risk Table                     Men   Women  1/2 Average Risk   3.4   3.3  Average Risk       5.0   4.4  2 X Average Risk   9.6   7.1  3 X Average Risk  23.4   11.0        Use the calculated Patient Ratio above and the CHD Risk Table to determine the patient's CHD Risk.        ATP III CLASSIFICATION (LDL):  <100     mg/dL   Optimal  100-129  mg/dL   Near or Above                    Optimal  130-159  mg/dL   Borderline  160-189  mg/dL   High  >190     mg/dL   Very High      Wt Readings from Last 3 Encounters:  06/21/15 154 lb 11.2 oz (70.171 kg)  03/23/14 151 lb 8 oz (68.72 kg)  03/11/13 152 lb 8 oz (69.174 kg)       ASSESSMENT AND PLAN:  No complaints of neurocardiogenic syncope or near syncope since pacemaker implantation. Normal pacemaker function. Continue with the CareLink downloads every 3 months and yearly office visits. Will retrieve lipid profile results from Dr. Wilson Singer.    Current medicines are reviewed at length with the patient today.  The patient does not have concerns regarding medicines.  The following changes have been made:  no change  Labs/ tests ordered today include:   Orders Placed This Encounter  Procedures  . EKG 12-Lead     Patient Instructions  Remote monitoring is used to monitor your Pacemaker from home. This monitoring reduces the number of office visits required to check your device to one time per year. It allows [place] to monitor the functioning of your device to ensure it is  working properly. You are scheduled for a device check from home on September 22, 2015. You may send your transmission at any time that day. If you have a wireless device, the transmission will be sent automatically. After your physician reviews your transmission, you will receive a postcard with your next transmission date.  Dr. Sallyanne Kuster recommends that you schedule a follow-up appointment in: [place]  Mikael Spray, MD  06/21/2015 1:58 PM    Sanda Klein, MD, [facility] [phone] office [phone] pager

## 2015-06-21 ENCOUNTER — Encounter: Payer: Self-pay | Admitting: Cardiovascular Disease

## 2015-06-21 ENCOUNTER — Ambulatory Visit (INDEPENDENT_AMBULATORY_CARE_PROVIDER_SITE_OTHER): Payer: PRIVATE HEALTH INSURANCE | Admitting: Cardiovascular Disease

## 2015-06-21 VITALS — BP 138/78 | HR 74 | Ht 61.5 in | Wt 154.7 lb

## 2015-06-21 DIAGNOSIS — E785 Hyperlipidemia, unspecified: Secondary | ICD-10-CM

## 2015-06-21 DIAGNOSIS — R55 Syncope and collapse: Secondary | ICD-10-CM

## 2015-06-21 DIAGNOSIS — Z950 Presence of cardiac pacemaker: Secondary | ICD-10-CM | POA: Diagnosis not present

## 2015-06-21 DIAGNOSIS — I469 Cardiac arrest, cause unspecified: Secondary | ICD-10-CM | POA: Diagnosis not present

## 2015-06-21 NOTE — Patient Instructions (Signed)
Remote monitoring is used to monitor your Pacemaker from home. This monitoring reduces the number of office visits required to check your device to one time per year. It allows [place] to monitor the functioning of your device to ensure it is working properly. You are scheduled for a device check from home on September 22, 2015. You may send your transmission at any time that day. If you have a wireless device, the transmission will be sent automatically. After your physician reviews your transmission, you will receive a postcard with your next transmission date.  Dr. Sallyanne Kuster recommends that you schedule a follow-up appointment in: [place]

## 2015-06-28 LAB — CUP PACEART INCLINIC DEVICE CHECK
Battery Remaining Longevity: 132 mo
Brady Statistic AP VP Percent: 0 %
Brady Statistic AS VP Percent: 0 %
Brady Statistic AS VS Percent: 99 %
Implantable Lead Location: 753859
Implantable Lead Location: 753860
Implantable Lead Model: 5076
Lead Channel Pacing Threshold Amplitude: 0.375 V
Lead Channel Pacing Threshold Amplitude: 0.625 V
Lead Channel Pacing Threshold Pulse Width: 0.4 ms
Lead Channel Sensing Intrinsic Amplitude: 16 mV
Lead Channel Setting Sensing Sensitivity: 5.6 mV
MDC IDC LEAD IMPLANT DT: 20100112
MDC IDC LEAD IMPLANT DT: 20100112
MDC IDC MSMT BATTERY IMPEDANCE: 296 Ohm
MDC IDC MSMT BATTERY VOLTAGE: 2.79 V
MDC IDC MSMT LEADCHNL RA IMPEDANCE VALUE: 419 Ohm
MDC IDC MSMT LEADCHNL RA SENSING INTR AMPL: 1.4 mV
MDC IDC MSMT LEADCHNL RV IMPEDANCE VALUE: 502 Ohm
MDC IDC MSMT LEADCHNL RV PACING THRESHOLD PULSEWIDTH: 0.4 ms
MDC IDC SESS DTM: 20161115140159
MDC IDC SET LEADCHNL RA PACING AMPLITUDE: 1.5 V
MDC IDC SET LEADCHNL RV PACING AMPLITUDE: 2 V
MDC IDC SET LEADCHNL RV PACING PULSEWIDTH: 0.4 ms
MDC IDC STAT BRADY AP VS PERCENT: 0 %

## 2015-07-07 ENCOUNTER — Encounter: Payer: Self-pay | Admitting: Cardiovascular Disease

## 2015-09-22 ENCOUNTER — Telehealth: Payer: Self-pay | Admitting: Cardiology

## 2015-09-22 ENCOUNTER — Other Ambulatory Visit: Payer: Self-pay

## 2015-09-22 ENCOUNTER — Ambulatory Visit (INDEPENDENT_AMBULATORY_CARE_PROVIDER_SITE_OTHER): Payer: PRIVATE HEALTH INSURANCE | Admitting: *Deleted

## 2015-09-22 ENCOUNTER — Ambulatory Visit
Admission: RE | Admit: 2015-09-22 | Discharge: 2015-09-22 | Disposition: A | Discharge status: Home / Self Care | Payer: PRIVATE HEALTH INSURANCE | Source: Ambulatory Visit

## 2015-09-22 DIAGNOSIS — Z1231 Encounter for screening mammogram for malignant neoplasm of breast: Secondary | ICD-10-CM

## 2015-09-22 DIAGNOSIS — I469 Cardiac arrest, cause unspecified: Secondary | ICD-10-CM | POA: Diagnosis not present

## 2015-09-22 NOTE — Telephone Encounter (Signed)
LMOVM reminding pt to send remote transmission.   

## 2015-09-23 NOTE — Progress Notes (Signed)
Remote pacemaker transmission.   

## 2015-10-09 LAB — CUP PACEART REMOTE DEVICE CHECK
Battery Impedance: 320 Ohm
Brady Statistic AP VP Percent: 0 %
Brady Statistic AP VS Percent: 0 %
Brady Statistic AS VS Percent: 99 %
Implantable Lead Implant Date: 20100112
Implantable Lead Location: 753859
Implantable Lead Model: 5076
Implantable Lead Model: 5076
Lead Channel Setting Pacing Amplitude: 2 V
Lead Channel Setting Pacing Pulse Width: 0.4 ms
Lead Channel Setting Sensing Sensitivity: 5.6 mV
MDC IDC LEAD IMPLANT DT: 20100112
MDC IDC LEAD LOCATION: 753860
MDC IDC MSMT BATTERY REMAINING LONGEVITY: 129 mo
MDC IDC MSMT BATTERY VOLTAGE: 2.79 V
MDC IDC MSMT LEADCHNL RA IMPEDANCE VALUE: 442 Ohm
MDC IDC MSMT LEADCHNL RV IMPEDANCE VALUE: 513 Ohm
MDC IDC SESS DTM: 20170217021713
MDC IDC SET LEADCHNL RA PACING AMPLITUDE: 1.5 V
MDC IDC STAT BRADY AS VP PERCENT: 0 %

## 2015-10-09 NOTE — Progress Notes (Signed)
Normal remote reviewed.  Next Carelink 12/21/15

## 2015-10-19 ENCOUNTER — Encounter: Payer: Self-pay | Admitting: Cardiology

## 2015-12-21 ENCOUNTER — Telehealth: Payer: Self-pay | Admitting: Cardiology

## 2015-12-21 ENCOUNTER — Ambulatory Visit (INDEPENDENT_AMBULATORY_CARE_PROVIDER_SITE_OTHER): Payer: PRIVATE HEALTH INSURANCE | Admitting: *Deleted

## 2015-12-21 DIAGNOSIS — I495 Sick sinus syndrome: Secondary | ICD-10-CM

## 2015-12-21 NOTE — Telephone Encounter (Signed)
LMOVM reminding pt to send remote transmission.   

## 2015-12-23 NOTE — Progress Notes (Signed)
Remote pacemaker transmission.   

## 2016-01-11 ENCOUNTER — Encounter: Payer: Self-pay | Admitting: Cardiology

## 2016-01-12 LAB — CUP PACEART REMOTE DEVICE CHECK
Brady Statistic AS VS Percent: 99 %
Date Time Interrogation Session: 20170518005433
Implantable Lead Location: 753859
Lead Channel Pacing Threshold Amplitude: 0.625 V
Lead Channel Pacing Threshold Pulse Width: 0.4 ms
Lead Channel Sensing Intrinsic Amplitude: 16 mV
Lead Channel Setting Pacing Pulse Width: 0.4 ms
Lead Channel Setting Sensing Sensitivity: 5.6 mV
MDC IDC LEAD IMPLANT DT: 20100112
MDC IDC LEAD IMPLANT DT: 20100112
MDC IDC LEAD LOCATION: 753860
MDC IDC MSMT BATTERY IMPEDANCE: 367 Ohm
MDC IDC MSMT BATTERY REMAINING LONGEVITY: 123 mo
MDC IDC MSMT BATTERY VOLTAGE: 2.78 V
MDC IDC MSMT LEADCHNL RA IMPEDANCE VALUE: 431 Ohm
MDC IDC MSMT LEADCHNL RA PACING THRESHOLD AMPLITUDE: 0.375 V
MDC IDC MSMT LEADCHNL RA PACING THRESHOLD PULSEWIDTH: 0.4 ms
MDC IDC MSMT LEADCHNL RA SENSING INTR AMPL: 2.8 mV
MDC IDC MSMT LEADCHNL RV IMPEDANCE VALUE: 511 Ohm
MDC IDC SET LEADCHNL RA PACING AMPLITUDE: 1.5 V
MDC IDC SET LEADCHNL RV PACING AMPLITUDE: 2 V
MDC IDC STAT BRADY AP VP PERCENT: 0 %
MDC IDC STAT BRADY AP VS PERCENT: 0 %
MDC IDC STAT BRADY AS VP PERCENT: 0 %

## 2016-01-27 ENCOUNTER — Encounter: Payer: Self-pay | Admitting: Cardiology

## 2016-03-27 ENCOUNTER — Ambulatory Visit (INDEPENDENT_AMBULATORY_CARE_PROVIDER_SITE_OTHER): Payer: PRIVATE HEALTH INSURANCE | Admitting: *Deleted

## 2016-03-27 ENCOUNTER — Telehealth: Payer: Self-pay | Admitting: Cardiology

## 2016-03-27 DIAGNOSIS — I495 Sick sinus syndrome: Secondary | ICD-10-CM | POA: Diagnosis not present

## 2016-03-27 NOTE — Telephone Encounter (Signed)
LMOVM reminding pt to send remote transmission.   

## 2016-03-28 NOTE — Progress Notes (Signed)
Remote pacemaker transmission.   

## 2016-04-04 ENCOUNTER — Encounter: Payer: Self-pay | Admitting: Cardiology

## 2016-04-05 LAB — CUP PACEART REMOTE DEVICE CHECK
Battery Impedance: 367 Ohm
Battery Remaining Longevity: 123 mo
Battery Voltage: 2.78 V
Brady Statistic AP VP Percent: 0 %
Brady Statistic AS VP Percent: 0 %
Date Time Interrogation Session: 20170822225015
Implantable Lead Implant Date: 20100112
Implantable Lead Location: 753860
Implantable Lead Model: 5076
Implantable Lead Model: 5076
Lead Channel Impedance Value: 425 Ohm
Lead Channel Pacing Threshold Amplitude: 0.375 V
Lead Channel Pacing Threshold Pulse Width: 0.4 ms
Lead Channel Sensing Intrinsic Amplitude: 1.4 mV
Lead Channel Setting Pacing Amplitude: 2 V
Lead Channel Setting Sensing Sensitivity: 5.6 mV
MDC IDC LEAD IMPLANT DT: 20100112
MDC IDC LEAD LOCATION: 753859
MDC IDC MSMT LEADCHNL RV IMPEDANCE VALUE: 522 Ohm
MDC IDC MSMT LEADCHNL RV PACING THRESHOLD AMPLITUDE: 0.75 V
MDC IDC MSMT LEADCHNL RV PACING THRESHOLD PULSEWIDTH: 0.4 ms
MDC IDC MSMT LEADCHNL RV SENSING INTR AMPL: 16 mV
MDC IDC SET LEADCHNL RA PACING AMPLITUDE: 1.5 V
MDC IDC SET LEADCHNL RV PACING PULSEWIDTH: 0.4 ms
MDC IDC STAT BRADY AP VS PERCENT: 1 %
MDC IDC STAT BRADY AS VS PERCENT: 99 %

## 2016-07-02 ENCOUNTER — Encounter: Payer: Self-pay | Admitting: *Deleted

## 2016-07-03 ENCOUNTER — Encounter: Payer: Self-pay | Admitting: Cardiovascular Disease

## 2016-07-03 ENCOUNTER — Ambulatory Visit (INDEPENDENT_AMBULATORY_CARE_PROVIDER_SITE_OTHER): Payer: PRIVATE HEALTH INSURANCE | Admitting: Cardiovascular Disease

## 2016-07-03 VITALS — BP 132/70 | HR 74 | Ht 61.5 in | Wt 150.4 lb

## 2016-07-03 DIAGNOSIS — E782 Mixed hyperlipidemia: Secondary | ICD-10-CM | POA: Diagnosis not present

## 2016-07-03 DIAGNOSIS — R55 Syncope and collapse: Secondary | ICD-10-CM | POA: Diagnosis not present

## 2016-07-03 DIAGNOSIS — Z950 Presence of cardiac pacemaker: Secondary | ICD-10-CM | POA: Diagnosis not present

## 2016-07-03 LAB — CUP PACEART INCLINIC DEVICE CHECK
Battery Impedance: 415 Ohm
Battery Voltage: 2.78 V
Brady Statistic AP VP Percent: 0 %
Brady Statistic AP VS Percent: 1 %
Brady Statistic AS VS Percent: 99 %
Implantable Lead Location: 753859
Implantable Lead Model: 5076
Implantable Lead Model: 5076
Lead Channel Impedance Value: 414 Ohm
Lead Channel Impedance Value: 488 Ohm
Lead Channel Pacing Threshold Amplitude: 0.375 V
Lead Channel Pacing Threshold Pulse Width: 0.4 ms
Lead Channel Sensing Intrinsic Amplitude: 1.4 mV
Lead Channel Sensing Intrinsic Amplitude: 16 mV
Lead Channel Setting Pacing Amplitude: 1.5 V
MDC IDC LEAD IMPLANT DT: 20100112
MDC IDC LEAD IMPLANT DT: 20100112
MDC IDC LEAD LOCATION: 753860
MDC IDC MSMT BATTERY REMAINING LONGEVITY: 118 mo
MDC IDC MSMT LEADCHNL RA PACING THRESHOLD PULSEWIDTH: 0.4 ms
MDC IDC MSMT LEADCHNL RV PACING THRESHOLD AMPLITUDE: 0.625 V
MDC IDC PG IMPLANT DT: 20100112
MDC IDC SESS DTM: 20171128101447
MDC IDC SET LEADCHNL RV PACING AMPLITUDE: 2 V
MDC IDC SET LEADCHNL RV PACING PULSEWIDTH: 0.4 ms
MDC IDC SET LEADCHNL RV SENSING SENSITIVITY: 5.6 mV
MDC IDC STAT BRADY AS VP PERCENT: 0 %

## 2016-07-03 NOTE — Patient Instructions (Signed)
Dr Sallyanne Kuster recommends that you continue on your current medications as directed. Please refer to the Current Medication list given to you today.  Remote monitoring is used to monitor your Pacemaker of ICD from home. This monitoring reduces the number of office visits required to check your device to one time per year. It allows [place] to keep an eye on the functioning of your device to ensure it is working properly. You are scheduled for a device check from home on Tuesday, February 27th, 2018. You may send your transmission at any time that day. If you have a wireless device, the transmission will be sent automatically. After your physician reviews your transmission, you will receive a postcard with your next transmission date.  Dr Sallyanne Kuster recommends that you schedule a follow-up appointment in 12 months with a pacemaker check. You will receive a reminder letter in the mail two months in advance. If you don't receive a letter, please call our office to schedule the follow-up appointment.  If you need a refill on your cardiac medications before your next appointment, please call your pharmacy.

## 2016-07-03 NOTE — Progress Notes (Signed)
Cardiology Office Note    Date:  07/03/2016   ID:  Sherry Davis, DOB 14-Jan-1951, MRN [id number]  PCP:  Dwan Bolt, MD  Cardiologist:   Sanda Klein, MD   Chief Complaint  Patient presents with  . Follow-up    no chest pain, shortness of breath, edema, pain or cramping in legs, lightheaded or dizziness; no complaints     History of Present Illness:  Sherry Davis is a 65 y.o. female with history of neurocardiogenic syncope and with cardioinhibitory component for which received a pacemaker in 2010 also followed for hyperlipidemia.  Since her last appointment she has not had any new medical problems. Denies syncope or presyncope and has not had problems with angina, dyspnea, edema, claudication, focal neurological complaints, palpitations.  The isthmic interrogation shows normal device function. Atrial pacing occurs 0.7% of the time and she only paces the ventricle 0.3% of the time. Battery longevity is estimated at around 10 years. Lead parameters and heart rate histogram are normal. There have been no episodes of meaningful arrhythmia other than a very brief episodes of paroxysmal atrial tachycardia lasting for a few seconds. Recent labs show excellent parameters with total cholesterol 134, LDL 65, triglycerides 139. Only the HDL is low at 41, a chronic problem. Glucose is normal at 103 and creatinine 0.9. Liver function tests are normal as are the electrolytes.   Past Medical History:  Diagnosis Date  . Dyslipidemia   . GERD (gastroesophageal reflux disease)   . Presence of permanent cardiac pacemaker 08/17/2008   Medtroic Adapta  . 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 Syncope     Past Surgical History:  Procedure Laterality Date  . BREAST LUMPECTOMY    . COLONOSCOPY  02/23/2015  . DILATION AND CURETTAGE OF UTERUS    . PERMANENT PACEMAKER INSERTION  08/17/2008   Medtronic adapta    Current Medications: Outpatient Medications Prior to  Visit  Medication Sig Dispense Refill  . aspirin 81 MG tablet Take 81 mg by mouth 2 (two) times daily.    Marland Kitchen ezetimibe (ZETIA) 10 MG tablet Take 10 mg by mouth daily.    . rosuvastatin (CRESTOR) 20 MG tablet Take 20 mg by mouth daily.     No facility-administered medications prior to visit.      Allergies:   Penicillins   Social History   Social History  . Marital status: Married    Spouse name: N/A  . Number of children: N/A  . Years of education: N/A   Social History Main Topics  . Smoking status: Former Smoker    Types: Cigarettes  . Smokeless tobacco: Never Used  . Alcohol use No  . Drug use: No  . Sexual activity: Not Asked   Other Topics Concern  . None   Social History Narrative  . None     Family History:  The patient's family history includes Hyperlipidemia in her father and mother; Hypertension in her brother and mother.   ROS:   Please see the history of present illness.    ROS All other systems reviewed and are negative.   PHYSICAL EXAM:   VS:  BP 132/70 (BP Location: Right Arm, Patient Position: Sitting, Cuff Size: Normal)   Pulse 74   Ht 5' 1.5" (1.562 m)   Wt 150 lb 6 oz (68.2 kg)   BMI 27.95 kg/m    GEN: Well nourished, well developed, in no acute distress  HEENT: normal  Neck: no JVD, carotid  bruits, or masses Cardiac: RRR; no murmurs, rubs, or gallops,no edema  Respiratory:  clear to auscultation bilaterally, normal work of breathing, healthy left subclavian pacemaker site GI: soft, nontender, nondistended, + BS MS: no deformity or atrophy  Skin: warm and dry, no rash Neuro:  Alert and Oriented x 3, Strength and sensation are intact Psych: euthymic mood, full affect  Wt Readings from Last 3 Encounters:  07/03/16 150 lb 6 oz (68.2 kg)  06/21/15 154 lb 11.2 oz (70.2 kg)  03/23/14 151 lb 8 oz (68.7 kg)      Studies/Labs Reviewed:   EKG:  EKG is ordered today.  The ekg ordered today demonstrates Normal sinus rhythm  Recent  Labs: total cholesterol 134, LDL 65, triglycerides 139. Only the HDL is low at 41, a chronic problem. Glucose is normal at 103 and creatinine 0.9. Liver function tests are normal as are the electrolytes.   ASSESSMENT:    1. Neurocardiogenic syncope   2. Pacemaker-MDT dual ch, impl 2010.   3. Mixed hyperlipidemia      PLAN:  In order of problems listed above:  1. Neurocardiogenic syncope, resolved following pacemaker implantation. 2. PPM: Normal device function. Continue remote downloads every 3 months and office visit yearly. 3. HLP: All lipid parameters are within desirable range with the exception of the low HDL. Encouraged more physical activity. She is only mildly overweight but would benefit from additional weight loss, limited intake of carbohydrates.    Medication Adjustments/Labs and Tests Ordered: Current medicines are reviewed at length with the patient today.  Concerns regarding medicines are outlined above.  Medication changes, Labs and Tests ordered today are listed in the Patient Instructions below. Patient Instructions  Dr Sallyanne Kuster recommends that you continue on your current medications as directed. Please refer to the Current Medication list given to you today.  Remote monitoring is used to monitor your Pacemaker of ICD from home. This monitoring reduces the number of office visits required to check your device to one time per year. It allows [place] to keep an eye on the functioning of your device to ensure it is working properly. You are scheduled for a device check from home on Tuesday, February 27th, 2018. You may send your transmission at any time that day. If you have a wireless device, the transmission will be sent automatically. After your physician reviews your transmission, you will receive a postcard with your next transmission date.  Dr Sallyanne Kuster recommends that you schedule a follow-up appointment in 12 months with a pacemaker check. You will receive a reminder  letter in the mail two months in advance. If you don't receive a letter, please call our office to schedule the follow-up appointment.  If you need a refill on your cardiac medications before your next appointment, please call your pharmacy.    Signed, Sanda Klein, MD  07/03/2016 8:57 AM    [facility] [place], [place],   [address] Phone: [phone]; Fax: [phone]

## 2016-10-02 ENCOUNTER — Ambulatory Visit (INDEPENDENT_AMBULATORY_CARE_PROVIDER_SITE_OTHER): Payer: PRIVATE HEALTH INSURANCE | Admitting: *Deleted

## 2016-10-02 ENCOUNTER — Telehealth: Payer: Self-pay | Admitting: Cardiology

## 2016-10-02 DIAGNOSIS — I495 Sick sinus syndrome: Secondary | ICD-10-CM | POA: Diagnosis not present

## 2016-10-02 NOTE — Telephone Encounter (Signed)
LMOVM reminding pt to send remote transmission.   

## 2016-10-03 ENCOUNTER — Ambulatory Visit
Admission: RE | Admit: 2016-10-03 | Discharge: 2016-10-03 | Disposition: A | Discharge status: Home / Self Care | Payer: PRIVATE HEALTH INSURANCE | Source: Ambulatory Visit | Attending: Gynecology | Admitting: Gynecology

## 2016-10-03 ENCOUNTER — Encounter: Payer: Self-pay | Admitting: Cardiology

## 2016-10-03 ENCOUNTER — Other Ambulatory Visit: Payer: Self-pay | Admitting: Gynecology

## 2016-10-03 DIAGNOSIS — Z1231 Encounter for screening mammogram for malignant neoplasm of breast: Secondary | ICD-10-CM

## 2016-10-03 NOTE — Progress Notes (Signed)
Remote pacemaker transmission.   

## 2016-10-04 LAB — CUP PACEART REMOTE DEVICE CHECK
Battery Remaining Longevity: 115 mo
Brady Statistic AP VP Percent: 0 %
Brady Statistic AP VS Percent: 1 %
Brady Statistic AS VP Percent: 0 %
Implantable Lead Implant Date: 20100112
Implantable Lead Model: 5076
Implantable Pulse Generator Implant Date: 20100112
Lead Channel Impedance Value: 408 Ohm
Lead Channel Impedance Value: 476 Ohm
Lead Channel Pacing Threshold Amplitude: 0.375 V
Lead Channel Pacing Threshold Amplitude: 0.75 V
Lead Channel Pacing Threshold Pulse Width: 0.4 ms
Lead Channel Setting Pacing Amplitude: 1.5 V
MDC IDC LEAD IMPLANT DT: 20100112
MDC IDC LEAD LOCATION: 753859
MDC IDC LEAD LOCATION: 753860
MDC IDC MSMT BATTERY IMPEDANCE: 441 Ohm
MDC IDC MSMT BATTERY VOLTAGE: 2.79 V
MDC IDC MSMT LEADCHNL RV PACING THRESHOLD PULSEWIDTH: 0.4 ms
MDC IDC SESS DTM: 20180228004336
MDC IDC SET LEADCHNL RV PACING AMPLITUDE: 2 V
MDC IDC SET LEADCHNL RV PACING PULSEWIDTH: 0.4 ms
MDC IDC SET LEADCHNL RV SENSING SENSITIVITY: 5.6 mV
MDC IDC STAT BRADY AS VS PERCENT: 99 %

## 2017-01-02 ENCOUNTER — Ambulatory Visit (INDEPENDENT_AMBULATORY_CARE_PROVIDER_SITE_OTHER): Payer: PRIVATE HEALTH INSURANCE | Admitting: *Deleted

## 2017-01-02 DIAGNOSIS — I495 Sick sinus syndrome: Secondary | ICD-10-CM | POA: Diagnosis not present

## 2017-01-04 LAB — CUP PACEART REMOTE DEVICE CHECK
Battery Remaining Longevity: 113 mo
Battery Voltage: 2.78 V
Date Time Interrogation Session: 20180530224800
Implantable Lead Implant Date: 20100112
Implantable Lead Location: 753860
Implantable Pulse Generator Implant Date: 20100112
Lead Channel Impedance Value: 408 Ohm
Lead Channel Pacing Threshold Amplitude: 0.75 V
Lead Channel Pacing Threshold Pulse Width: 0.4 ms
Lead Channel Pacing Threshold Pulse Width: 0.4 ms
Lead Channel Setting Pacing Amplitude: 1.5 V
Lead Channel Setting Pacing Amplitude: 2 V
Lead Channel Setting Sensing Sensitivity: 5.6 mV
MDC IDC LEAD IMPLANT DT: 20100112
MDC IDC LEAD LOCATION: 753859
MDC IDC MSMT BATTERY IMPEDANCE: 464 Ohm
MDC IDC MSMT LEADCHNL RA PACING THRESHOLD AMPLITUDE: 0.5 V
MDC IDC MSMT LEADCHNL RA SENSING INTR AMPL: 1.4 mV
MDC IDC MSMT LEADCHNL RV IMPEDANCE VALUE: 485 Ohm
MDC IDC MSMT LEADCHNL RV SENSING INTR AMPL: 16 mV
MDC IDC SET LEADCHNL RV PACING PULSEWIDTH: 0.4 ms
MDC IDC STAT BRADY AP VP PERCENT: 0 %
MDC IDC STAT BRADY AP VS PERCENT: 1 %
MDC IDC STAT BRADY AS VP PERCENT: 0 %
MDC IDC STAT BRADY AS VS PERCENT: 99 %

## 2017-01-04 NOTE — Progress Notes (Signed)
Remote pacemaker transmission.   

## 2017-01-11 ENCOUNTER — Encounter: Payer: Self-pay | Admitting: Cardiology

## 2017-04-03 ENCOUNTER — Ambulatory Visit (INDEPENDENT_AMBULATORY_CARE_PROVIDER_SITE_OTHER): Payer: PRIVATE HEALTH INSURANCE | Admitting: *Deleted

## 2017-04-03 DIAGNOSIS — I495 Sick sinus syndrome: Secondary | ICD-10-CM | POA: Diagnosis not present

## 2017-04-04 NOTE — Progress Notes (Signed)
Remote pacemaker transmission.   

## 2017-04-16 ENCOUNTER — Encounter: Payer: Self-pay | Admitting: Cardiology

## 2017-04-19 LAB — CUP PACEART REMOTE DEVICE CHECK
Battery Impedance: 513 Ohm
Battery Voltage: 2.78 V
Brady Statistic AP VS Percent: 1 %
Brady Statistic AS VS Percent: 99 %
Date Time Interrogation Session: 20180830012412
Implantable Lead Implant Date: 20100112
Implantable Lead Location: 753859
Implantable Lead Location: 753860
Implantable Lead Model: 5076
Lead Channel Pacing Threshold Pulse Width: 0.4 ms
Lead Channel Setting Pacing Amplitude: 2 V
Lead Channel Setting Pacing Pulse Width: 0.4 ms
Lead Channel Setting Sensing Sensitivity: 5.6 mV
MDC IDC LEAD IMPLANT DT: 20100112
MDC IDC MSMT BATTERY REMAINING LONGEVITY: 108 mo
MDC IDC MSMT LEADCHNL RA IMPEDANCE VALUE: 398 Ohm
MDC IDC MSMT LEADCHNL RA PACING THRESHOLD AMPLITUDE: 0.5 V
MDC IDC MSMT LEADCHNL RA PACING THRESHOLD PULSEWIDTH: 0.4 ms
MDC IDC MSMT LEADCHNL RV IMPEDANCE VALUE: 462 Ohm
MDC IDC MSMT LEADCHNL RV PACING THRESHOLD AMPLITUDE: 0.875 V
MDC IDC PG IMPLANT DT: 20100112
MDC IDC SET LEADCHNL RA PACING AMPLITUDE: 1.5 V
MDC IDC STAT BRADY AP VP PERCENT: 0 %
MDC IDC STAT BRADY AS VP PERCENT: 0 %

## 2017-06-29 ENCOUNTER — Encounter: Payer: Self-pay | Admitting: Cardiovascular Disease

## 2017-07-03 ENCOUNTER — Ambulatory Visit (INDEPENDENT_AMBULATORY_CARE_PROVIDER_SITE_OTHER): Payer: PRIVATE HEALTH INSURANCE | Admitting: *Deleted

## 2017-07-03 ENCOUNTER — Telehealth: Payer: Self-pay | Admitting: Cardiology

## 2017-07-03 DIAGNOSIS — I495 Sick sinus syndrome: Secondary | ICD-10-CM

## 2017-07-03 NOTE — Telephone Encounter (Signed)
Confirmed remote transmission w/ pt husband.   

## 2017-07-04 NOTE — Progress Notes (Signed)
Remote pacemaker transmission.   

## 2017-07-05 ENCOUNTER — Encounter: Payer: Self-pay | Admitting: Cardiology

## 2017-07-05 LAB — CUP PACEART REMOTE DEVICE CHECK
Battery Impedance: 537 Ohm
Battery Remaining Longevity: 106 mo
Brady Statistic AP VP Percent: 0 %
Implantable Lead Location: 753859
Implantable Lead Location: 753860
Implantable Lead Model: 5076
Implantable Lead Model: 5076
Implantable Pulse Generator Implant Date: 20100112
Lead Channel Pacing Threshold Amplitude: 0.5 V
Lead Channel Sensing Intrinsic Amplitude: 2.8 mV
Lead Channel Setting Pacing Amplitude: 2 V
Lead Channel Setting Pacing Pulse Width: 0.4 ms
MDC IDC LEAD IMPLANT DT: 20100112
MDC IDC LEAD IMPLANT DT: 20100112
MDC IDC MSMT BATTERY VOLTAGE: 2.78 V
MDC IDC MSMT LEADCHNL RA IMPEDANCE VALUE: 408 Ohm
MDC IDC MSMT LEADCHNL RA PACING THRESHOLD PULSEWIDTH: 0.4 ms
MDC IDC MSMT LEADCHNL RV IMPEDANCE VALUE: 502 Ohm
MDC IDC MSMT LEADCHNL RV PACING THRESHOLD AMPLITUDE: 0.875 V
MDC IDC MSMT LEADCHNL RV PACING THRESHOLD PULSEWIDTH: 0.4 ms
MDC IDC MSMT LEADCHNL RV SENSING INTR AMPL: 16 mV
MDC IDC SESS DTM: 20181129021100
MDC IDC SET LEADCHNL RA PACING AMPLITUDE: 1.5 V
MDC IDC SET LEADCHNL RV SENSING SENSITIVITY: 5.6 mV
MDC IDC STAT BRADY AP VS PERCENT: 1 %
MDC IDC STAT BRADY AS VP PERCENT: 0 %
MDC IDC STAT BRADY AS VS PERCENT: 99 %

## 2017-07-17 ENCOUNTER — Encounter: Payer: PRIVATE HEALTH INSURANCE | Admitting: Cardiovascular Disease

## 2017-09-25 ENCOUNTER — Encounter: Payer: Self-pay | Admitting: Cardiovascular Disease

## 2017-09-25 ENCOUNTER — Ambulatory Visit (INDEPENDENT_AMBULATORY_CARE_PROVIDER_SITE_OTHER): Payer: PRIVATE HEALTH INSURANCE | Admitting: Cardiovascular Disease

## 2017-09-25 VITALS — BP 134/70 | HR 72 | Ht 61.5 in | Wt 145.0 lb

## 2017-09-25 DIAGNOSIS — R55 Syncope and collapse: Secondary | ICD-10-CM | POA: Diagnosis not present

## 2017-09-25 DIAGNOSIS — I469 Cardiac arrest, cause unspecified: Secondary | ICD-10-CM

## 2017-09-25 DIAGNOSIS — E7800 Pure hypercholesterolemia, unspecified: Secondary | ICD-10-CM

## 2017-09-25 DIAGNOSIS — Z950 Presence of cardiac pacemaker: Secondary | ICD-10-CM | POA: Diagnosis not present

## 2017-09-25 LAB — CUP PACEART INCLINIC DEVICE CHECK
Date Time Interrogation Session: 20190220120521
Implantable Lead Implant Date: 20100112
Implantable Lead Location: 753859
Implantable Lead Model: 5076
MDC IDC LEAD IMPLANT DT: 20100112
MDC IDC LEAD LOCATION: 753860
MDC IDC PG IMPLANT DT: 20100112

## 2017-09-25 NOTE — Progress Notes (Signed)
Cardiology Office Note    Date:  09/25/2017   ID:  Sherry Davis, Sherry Davis [birth year], MRN [id number]  PCP:  Sherry Pretty, MD  Cardiologist:   Sherry Klein, MD   No chief complaint: Follow-up neurocardiogenic syncope, pacemaker check, hyperlipidemia   History of Present Illness:  Sherry Davis is a 67 y.o. female with history of neurocardiogenic syncope and with cardioinhibitory component for which received a pacemaker in 2010 also followed for hyperlipidemia.   She feels well.  She has not had any new syncopal events and has not had syncope since her pacemaker was implanted.  She denies any cardiovascular complaints. The patient specifically denies any chest pain at rest exertion, dyspnea at rest or with exertion, orthopnea, paroxysmal nocturnal dyspnea, syncope, palpitations, focal neurological deficits, intermittent claudication, lower extremity edema, unexplained weight gain, cough, hemoptysis or wheezing.  Pacemaker interrogation shows normal device function. Atrial pacing occurs only 1.3 % of the time and she only paces the ventricle 0.3 % of the time. Battery longevity is estimated at 8.5 years. Lead parameters and heart rate histogram are normal. There have been no episodes of meaningful arrhythmia other than a very brief episodes of paroxysmal atrial tachycardia: Only 2 episodes in the last 12 months, each lasting for 3 seconds, approximately 200 bpm.   Labs including lipid profile has been followed by Sherry Davis every September and May.   Past Medical History:  Diagnosis Date  . Dyslipidemia   . GERD (gastroesophageal reflux disease)   . Presence of permanent cardiac pacemaker 08/17/2008   Medtroic Adapta  . 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 Syncope     Past Surgical History:  Procedure Laterality Date  . BREAST EXCISIONAL BIOPSY Right 2000  . BREAST LUMPECTOMY    . COLONOSCOPY  02/23/2015  . DILATION AND CURETTAGE OF UTERUS    . PERMANENT  PACEMAKER INSERTION  08/17/2008   Medtronic adapta    Current Medications: Outpatient Medications Prior to Visit  Medication Sig Dispense Refill  . aspirin 81 MG tablet Take 81 mg by mouth 2 (two) times daily.    Marland Kitchen ezetimibe (ZETIA) 10 MG tablet Take 10 mg by mouth daily.    . rosuvastatin (CRESTOR) 20 MG tablet Take 20 mg by mouth daily.     No facility-administered medications prior to visit.      Allergies:   Penicillins   Social History   Socioeconomic History  . Marital status: Married    Spouse name: None  . Number of children: None  . Years of education: None  . Highest education level: None  Social Needs  . Financial resource strain: None  . Food insecurity - worry: None  . Food insecurity - inability: None  . Transportation needs - medical: None  . Transportation needs - non-medical: None  Occupational History  . None  Tobacco Use  . Smoking status: Former Smoker    Types: Cigarettes  . Smokeless tobacco: Never Used  Substance and Sexual Activity  . Alcohol use: No    Alcohol/week: 0.0 oz  . Drug use: No  . Sexual activity: None  Other Topics Concern  . None  Social History Narrative  . None     Family History:  The patient's family history includes Hyperlipidemia in her father and mother; Hypertension in her brother and mother.   ROS:   Please see the history of present illness.    ROS All other systems reviewed and are negative.  PHYSICAL EXAM:   VS:  BP 134/70   Pulse 72   Ht 5' 1.5" (1.562 m)   Wt 145 lb (65.8 kg)   BMI 26.95 kg/m     General: Alert, oriented x3, no distress, healthy left subclavian pacemaker site Head: no evidence of trauma, PERRL, EOMI, no exophtalmos or lid lag, no myxedema, no xanthelasma; normal ears, nose and oropharynx Neck: normal jugular venous pulsations and no hepatojugular reflux; brisk carotid pulses without delay and no carotid bruits Chest: clear to auscultation, no signs of consolidation by percussion or  palpation, normal fremitus, symmetrical and full respiratory excursions Cardiovascular: normal position and quality of the apical impulse, regular rhythm, normal first and second heart sounds, no murmurs, rubs or gallops Abdomen: no tenderness or distention, no masses by palpation, no abnormal pulsatility or arterial bruits, normal bowel sounds, no hepatosplenomegaly Extremities: no clubbing, cyanosis or edema; 2+ radial, ulnar and brachial pulses bilaterally; 2+ right femoral, posterior tibial and dorsalis pedis pulses; 2+ left femoral, posterior tibial and dorsalis pedis pulses; no subclavian or femoral bruits Neurological: grossly nonfocal Psych: Normal mood and affect   Wt Readings from Last 3 Encounters:  09/25/17 145 lb (65.8 kg)  07/03/16 150 lb 6 oz (68.2 kg)  06/21/15 154 lb 11.2 oz (70.2 kg)      Studies/Labs Reviewed:   EKG:  EKG is ordered today.  The ekg ordered today demonstrates normal sinus rhythm, normal tracing  Recent Labs: total cholesterol 134, LDL 65, triglycerides 139. Only the HDL is low at 41, a chronic problem. Glucose is normal at 103 and creatinine 0.9. Liver function tests are normal as are the electrolytes.   ASSESSMENT:    No diagnosis found.   PLAN:  In order of problems listed above:  1. Neurocardiogenic syncope, has not had any recurrence of syncope since the pacemaker was implanted 2. PPM: No changes made to programmed parameters today.  Continue remote downloads every 3 months and yearly office visit.. 3. HLP: As before the biggest remaining problem is low HDL, encouraged more physical activity.    Medication Adjustments/Labs and Tests Ordered: Current medicines are reviewed at length with the patient today.  Concerns regarding medicines are outlined above.  Medication changes, Labs and Tests ordered today are listed in the Patient Instructions below. There are no Patient Instructions on file for this visit.   Signed, Sherry Klein, MD    09/25/2017 10:14 AM    [facility], [place], [place]  [address] Phone: [phone]; Fax: [phone]

## 2017-09-25 NOTE — Progress Notes (Signed)
Error - duplicate

## 2017-09-25 NOTE — Patient Instructions (Addendum)

## 2017-10-09 ENCOUNTER — Other Ambulatory Visit: Payer: Self-pay | Admitting: Gynecology

## 2017-10-09 ENCOUNTER — Ambulatory Visit
Admission: RE | Admit: 2017-10-09 | Discharge: 2017-10-09 | Disposition: A | Discharge status: Home / Self Care | Payer: PRIVATE HEALTH INSURANCE | Source: Ambulatory Visit | Attending: Gynecology | Admitting: Gynecology

## 2017-10-09 DIAGNOSIS — Z1231 Encounter for screening mammogram for malignant neoplasm of breast: Secondary | ICD-10-CM

## 2017-12-25 ENCOUNTER — Ambulatory Visit (INDEPENDENT_AMBULATORY_CARE_PROVIDER_SITE_OTHER): Payer: PRIVATE HEALTH INSURANCE | Admitting: *Deleted

## 2017-12-25 ENCOUNTER — Telehealth: Payer: Self-pay | Admitting: Cardiology

## 2017-12-25 ENCOUNTER — Telehealth: Payer: Self-pay | Admitting: Cardiovascular Disease

## 2017-12-25 DIAGNOSIS — I495 Sick sinus syndrome: Secondary | ICD-10-CM

## 2017-12-25 NOTE — Telephone Encounter (Signed)
LMOVM reminding pt to send remote transmission.   

## 2017-12-25 NOTE — Telephone Encounter (Signed)
LMOVM for pt informing her that we did receive her remote appt.

## 2017-12-25 NOTE — Telephone Encounter (Signed)
New message ° ° ° ° °1. Has your device fired? NO ° °2. Is you device beeping? NO ° °3. Are you experiencing draining or swelling at device site? NO ° °4. Are you calling to see if we received your device transmission? YES ° °5. Have you passed out? NO ° ° ° °Please route to Device Clinic Pool °

## 2017-12-26 ENCOUNTER — Encounter: Payer: Self-pay | Admitting: Cardiology

## 2017-12-26 NOTE — Progress Notes (Signed)
Remote pacemaker transmission.   

## 2017-12-26 NOTE — Progress Notes (Signed)
Letter  

## 2018-01-21 LAB — CUP PACEART REMOTE DEVICE CHECK
Brady Statistic AP VS Percent: 1 %
Brady Statistic AS VP Percent: 0 %
Implantable Lead Implant Date: 20100112
Implantable Lead Location: 753859
Implantable Lead Model: 5076
Lead Channel Pacing Threshold Amplitude: 0.5 V
Lead Channel Pacing Threshold Amplitude: 0.875 V
Lead Channel Pacing Threshold Pulse Width: 0.4 ms
Lead Channel Pacing Threshold Pulse Width: 0.4 ms
Lead Channel Setting Pacing Amplitude: 2 V
Lead Channel Setting Sensing Sensitivity: 5.6 mV
MDC IDC LEAD IMPLANT DT: 20100112
MDC IDC LEAD LOCATION: 753860
MDC IDC MSMT BATTERY IMPEDANCE: 661 Ohm
MDC IDC MSMT BATTERY REMAINING LONGEVITY: 96 mo
MDC IDC MSMT BATTERY VOLTAGE: 2.78 V
MDC IDC MSMT LEADCHNL RA IMPEDANCE VALUE: 398 Ohm
MDC IDC MSMT LEADCHNL RV IMPEDANCE VALUE: 487 Ohm
MDC IDC PG IMPLANT DT: 20100112
MDC IDC SESS DTM: 20190522202033
MDC IDC SET LEADCHNL RA PACING AMPLITUDE: 1.5 V
MDC IDC SET LEADCHNL RV PACING PULSEWIDTH: 0.4 ms
MDC IDC STAT BRADY AP VP PERCENT: 0 %
MDC IDC STAT BRADY AS VS PERCENT: 99 %

## 2018-03-26 ENCOUNTER — Telehealth: Payer: Self-pay | Admitting: Cardiology

## 2018-03-26 ENCOUNTER — Ambulatory Visit (INDEPENDENT_AMBULATORY_CARE_PROVIDER_SITE_OTHER): Payer: PRIVATE HEALTH INSURANCE | Admitting: *Deleted

## 2018-03-26 ENCOUNTER — Other Ambulatory Visit: Payer: Self-pay | Admitting: Endocrinology

## 2018-03-26 DIAGNOSIS — I495 Sick sinus syndrome: Secondary | ICD-10-CM

## 2018-03-26 DIAGNOSIS — E041 Nontoxic single thyroid nodule: Secondary | ICD-10-CM

## 2018-03-26 DIAGNOSIS — R001 Bradycardia, unspecified: Secondary | ICD-10-CM

## 2018-03-26 NOTE — Telephone Encounter (Signed)
Spoke with pt and reminded pt of remote transmission that is due today. Pt verbalized understanding.   

## 2018-03-27 NOTE — Progress Notes (Signed)
Remote pacemaker transmission.   

## 2018-03-28 ENCOUNTER — Encounter: Payer: Self-pay | Admitting: Cardiology

## 2018-03-28 NOTE — Progress Notes (Signed)
Letter  

## 2018-04-04 ENCOUNTER — Ambulatory Visit
Admission: RE | Admit: 2018-04-04 | Discharge: 2018-04-04 | Disposition: A | Discharge status: Home / Self Care | Payer: PRIVATE HEALTH INSURANCE | Source: Ambulatory Visit | Attending: Endocrinology | Admitting: Endocrinology

## 2018-04-04 DIAGNOSIS — E041 Nontoxic single thyroid nodule: Secondary | ICD-10-CM

## 2018-04-15 ENCOUNTER — Other Ambulatory Visit: Payer: Self-pay | Admitting: Endocrinology

## 2018-04-15 DIAGNOSIS — E041 Nontoxic single thyroid nodule: Secondary | ICD-10-CM

## 2018-04-29 LAB — CUP PACEART REMOTE DEVICE CHECK
Battery Remaining Longevity: 91 mo
Battery Voltage: 2.79 V
Brady Statistic AS VS Percent: 98 %
Implantable Lead Implant Date: 20100112
Implantable Lead Implant Date: 20100112
Implantable Lead Location: 753860
Implantable Pulse Generator Implant Date: 20100112
Lead Channel Impedance Value: 529 Ohm
Lead Channel Pacing Threshold Amplitude: 0.875 V
Lead Channel Setting Pacing Amplitude: 1.5 V
Lead Channel Setting Pacing Amplitude: 2 V
Lead Channel Setting Sensing Sensitivity: 5.6 mV
MDC IDC LEAD LOCATION: 753859
MDC IDC MSMT BATTERY IMPEDANCE: 735 Ohm
MDC IDC MSMT LEADCHNL RA IMPEDANCE VALUE: 403 Ohm
MDC IDC MSMT LEADCHNL RA PACING THRESHOLD AMPLITUDE: 0.5 V
MDC IDC MSMT LEADCHNL RA PACING THRESHOLD PULSEWIDTH: 0.4 ms
MDC IDC MSMT LEADCHNL RV PACING THRESHOLD PULSEWIDTH: 0.4 ms
MDC IDC SESS DTM: 20190822011722
MDC IDC SET LEADCHNL RV PACING PULSEWIDTH: 0.4 ms
MDC IDC STAT BRADY AP VP PERCENT: 0 %
MDC IDC STAT BRADY AP VS PERCENT: 2 %
MDC IDC STAT BRADY AS VP PERCENT: 0 %

## 2018-05-08 ENCOUNTER — Other Ambulatory Visit (HOSPITAL_COMMUNITY)
Admission: RE | Admit: 2018-05-08 | Discharge: 2018-05-08 | Disposition: A | Discharge status: Home / Self Care | Payer: PRIVATE HEALTH INSURANCE | Source: Ambulatory Visit | Attending: Physician Assistant | Admitting: Physician Assistant

## 2018-05-08 ENCOUNTER — Ambulatory Visit
Admission: RE | Admit: 2018-05-08 | Discharge: 2018-05-08 | Disposition: A | Discharge status: Home / Self Care | Payer: PRIVATE HEALTH INSURANCE | Source: Ambulatory Visit | Attending: Endocrinology | Admitting: Endocrinology

## 2018-05-08 DIAGNOSIS — E041 Nontoxic single thyroid nodule: Secondary | ICD-10-CM | POA: Diagnosis present

## 2018-05-08 NOTE — Procedures (Signed)
PROCEDURE SUMMARY:  Using direct ultrasound guidance, 5 passes were made using 25 g needles into the nodule within the left lobe of the thyroid.   Ultrasound was used to confirm needle placements on all occasions.   Specimens were sent to Pathology for analysis.  See procedure note under Imaging tab in Epic for full procedure details.  Matty Vanroekel S Kurt Azimi PA-C 05/08/2018 4:12 PM

## 2018-06-25 ENCOUNTER — Ambulatory Visit (INDEPENDENT_AMBULATORY_CARE_PROVIDER_SITE_OTHER): Payer: PRIVATE HEALTH INSURANCE

## 2018-06-25 ENCOUNTER — Telehealth: Payer: Self-pay

## 2018-06-25 DIAGNOSIS — I495 Sick sinus syndrome: Secondary | ICD-10-CM

## 2018-06-25 NOTE — Telephone Encounter (Signed)
LMOVM reminding pt to send remote transmission.   

## 2018-06-26 NOTE — Progress Notes (Signed)
Remote pacemaker transmission.   

## 2018-08-20 LAB — CUP PACEART REMOTE DEVICE CHECK
Battery Impedance: 759 Ohm
Battery Remaining Longevity: 90 mo
Battery Voltage: 2.78 V
Brady Statistic AP VP Percent: 0 %
Brady Statistic AP VS Percent: 2 %
Brady Statistic AS VP Percent: 0 %
Brady Statistic AS VS Percent: 98 %
Implantable Lead Implant Date: 20100112
Implantable Lead Implant Date: 20100112
Implantable Lead Location: 753859
Implantable Lead Model: 5076
Implantable Lead Model: 5076
Implantable Pulse Generator Implant Date: 20100112
Lead Channel Impedance Value: 414 Ohm
Lead Channel Pacing Threshold Amplitude: 0.5 V
Lead Channel Pacing Threshold Pulse Width: 0.4 ms
Lead Channel Pacing Threshold Pulse Width: 0.4 ms
Lead Channel Setting Pacing Amplitude: 1.5 V
Lead Channel Setting Pacing Pulse Width: 0.4 ms
MDC IDC LEAD LOCATION: 753860
MDC IDC MSMT LEADCHNL RV IMPEDANCE VALUE: 527 Ohm
MDC IDC MSMT LEADCHNL RV PACING THRESHOLD AMPLITUDE: 0.75 V
MDC IDC SESS DTM: 20191121013048
MDC IDC SET LEADCHNL RV PACING AMPLITUDE: 2 V
MDC IDC SET LEADCHNL RV SENSING SENSITIVITY: 5.6 mV

## 2018-09-17 ENCOUNTER — Other Ambulatory Visit: Payer: Self-pay

## 2018-09-17 ENCOUNTER — Ambulatory Visit: Payer: PRIVATE HEALTH INSURANCE | Admitting: Cardiovascular Disease

## 2018-09-17 ENCOUNTER — Encounter: Payer: Self-pay | Admitting: Cardiovascular Disease

## 2018-09-17 VITALS — BP 124/74 | HR 80 | Ht 61.0 in | Wt 148.0 lb

## 2018-09-17 DIAGNOSIS — M8580 Other specified disorders of bone density and structure, unspecified site: Secondary | ICD-10-CM | POA: Insufficient documentation

## 2018-09-17 DIAGNOSIS — R55 Syncope and collapse: Secondary | ICD-10-CM | POA: Diagnosis not present

## 2018-09-17 DIAGNOSIS — E785 Hyperlipidemia, unspecified: Secondary | ICD-10-CM

## 2018-09-17 DIAGNOSIS — IMO0002 Reserved for concepts with insufficient information to code with codable children: Secondary | ICD-10-CM | POA: Insufficient documentation

## 2018-09-17 DIAGNOSIS — Z950 Presence of cardiac pacemaker: Secondary | ICD-10-CM | POA: Diagnosis not present

## 2018-09-17 DIAGNOSIS — E039 Hypothyroidism, unspecified: Secondary | ICD-10-CM | POA: Diagnosis not present

## 2018-09-17 DIAGNOSIS — E663 Overweight: Secondary | ICD-10-CM

## 2018-09-17 DIAGNOSIS — Z780 Asymptomatic menopausal state: Secondary | ICD-10-CM | POA: Insufficient documentation

## 2018-09-17 NOTE — Patient Instructions (Signed)
Medication Instructions:  No change If you need a refill on your cardiac medications before your next appointment, please call your pharmacy.   Lab work: NONE  Testing/Procedures: NONE  Follow-Up: At [facility], you and your health needs are our priority.  As part of our continuing mission to provide you with exceptional heart care, we have created designated Provider Care Teams.  These Care Teams include your primary Cardiologist (physician) and Advanced Practice Providers (APPs -  Physician Assistants and Nurse Practitioners) who all work together to provide you with the care you need, when you need it. You will need a follow up appointment in 12 months.  Please call our office 2 months in advance to schedule this appointment.  You may see Sanda Klein, MD .

## 2018-09-17 NOTE — Progress Notes (Signed)
Cardiology Office Note    Date:  09/17/2018   ID:  Sherry Davis, DOB 05/26/1951, MRN [id number]  PCP:  Deland Pretty, MD  Cardiologist:   Sanda Klein, MD   No chief complaint: Follow-up neurocardiogenic syncope, pacemaker check, hyperlipidemia   History of Present Illness:  Sherry Davis is a 68 y.o. female with history of neurocardiogenic syncope and with cardioinhibitory component for which received a pacemaker in 2010 also followed for hyperlipidemia.  She feels great.  She has not had any episodes of syncope or near syncope.The patient specifically denies any chest pain at rest exertion, dyspnea at rest or with exertion, orthopnea, paroxysmal nocturnal dyspnea, syncope, palpitations, focal neurological deficits, intermittent claudication, lower extremity edema, unexplained weight gain, cough, hemoptysis or wheezing.  Pacemaker interrogation shows normal device function.  She has less than 1% pacing in either the atrium or the ventricle. Battery longevity is estimated at 5-9 0.5 years. Lead parameters and heart rate histogram are normal.  There have been no episodes of mode switch since her last download.  Labs including lipid profile has been followed by Dr. Shelia Media.  The most recent labs from May 2019 showed total cholesterol 118, HDL 40, LDL 54, triglycerides 119, creatinine 0.8, hemoglobin 13.8, potassium 4.5.  She had an increased TSH 5.07 and was started on levothyroxine last September.   Past Medical History:  Diagnosis Date  . Dyslipidemia   . GERD (gastroesophageal reflux disease)   . Presence of permanent cardiac pacemaker 08/17/2008   Medtroic Adapta  . 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 Syncope     Past Surgical History:  Procedure Laterality Date  . BREAST EXCISIONAL BIOPSY Right 2000  . BREAST LUMPECTOMY    . COLONOSCOPY  02/23/2015  . DILATION AND CURETTAGE OF UTERUS    . PERMANENT PACEMAKER INSERTION  08/17/2008   Medtronic  adapta    Current Medications: Outpatient Medications Prior to Visit  Medication Sig Dispense Refill  . ezetimibe (ZETIA) 10 MG tablet Take 10 mg by mouth daily.    Marland Kitchen levothyroxine (SYNTHROID, LEVOTHROID) 50 MCG tablet daily.    . rosuvastatin (CRESTOR) 20 MG tablet Take 20 mg by mouth daily.    Marland Kitchen aspirin 81 MG tablet Take 81 mg by mouth 2 (two) times daily.     No facility-administered medications prior to visit.      Allergies:   Penicillins   Social History   Socioeconomic History  . Marital status: Married    Spouse name: Not on file  . Number of children: Not on file  . Years of education: Not on file  . Highest education level: Not on file  Occupational History  . Not on file  Social Needs  . Financial resource strain: Not on file  . Food insecurity:    Worry: Not on file    Inability: Not on file  . Transportation needs:    Medical: Not on file    Non-medical: Not on file  Tobacco Use  . Smoking status: Former Smoker    Types: Cigarettes  . Smokeless tobacco: Never Used  Substance and Sexual Activity  . Alcohol use: No    Alcohol/week: 0.0 standard drinks  . Drug use: No  . Sexual activity: Not on file  Lifestyle  . Physical activity:    Days per week: Not on file    Minutes per session: Not on file  . Stress: Not on file  Relationships  . Social connections:  Talks on phone: Not on file    Gets together: Not on file    Attends religious service: Not on file    Active member of club or organization: Not on file    Attends meetings of clubs or organizations: Not on file    Relationship status: Not on file  Other Topics Concern  . Not on file  Social History Narrative  . Not on file     Family History:  The patient's family history includes Hyperlipidemia in her father and mother; Hypertension in her brother and mother.   ROS:   Please see the history of present illness.    ROS all other systems are reviewed and are negative   PHYSICAL EXAM:    VS:  BP 124/74   Pulse 80   Ht 5\' 1"  (1.549 m)   Wt 148 lb (67.1 kg)   SpO2 98%   BMI 27.96 kg/m      General: Alert, oriented x3, no distress, healthy left subclavian pacemaker site Head: no evidence of trauma, PERRL, EOMI, no exophtalmos or lid lag, no myxedema, no xanthelasma; normal ears, nose and oropharynx Neck: normal jugular venous pulsations and no hepatojugular reflux; brisk carotid pulses without delay and no carotid bruits Chest: clear to auscultation, no signs of consolidation by percussion or palpation, normal fremitus, symmetrical and full respiratory excursions Cardiovascular: normal position and quality of the apical impulse, regular rhythm, normal first and second heart sounds, no murmurs, rubs or gallops Abdomen: no tenderness or distention, no masses by palpation, no abnormal pulsatility or arterial bruits, normal bowel sounds, no hepatosplenomegaly Extremities: no clubbing, cyanosis or edema; 2+ radial, ulnar and brachial pulses bilaterally; 2+ right femoral, posterior tibial and dorsalis pedis pulses; 2+ left femoral, posterior tibial and dorsalis pedis pulses; no subclavian or femoral bruits Neurological: grossly nonfocal Psych: Normal mood and affect    Wt Readings from Last 3 Encounters:  09/17/18 148 lb (67.1 kg)  09/25/17 145 lb (65.8 kg)  07/03/16 150 lb 6 oz (68.2 kg)      Studies/Labs Reviewed:   EKG:  EKG is not ordered today.  The intracardiac electrogram shows atrial sensed, ventricular sensed (sinus rhythm)  Recent Labs: total cholesterol 134, LDL 65, triglycerides 139. Only the HDL is low at 41, a chronic problem. Glucose is normal at 103 and creatinine 0.9. Liver function tests are normal as are the electrolytes.   ASSESSMENT:    1. Neurocardiogenic syncope   2. Pacemaker   3. Dyslipidemia (high LDL; low HDL)   4. Acquired hypothyroidism      PLAN:  In order of problems listed above:  1. Neurocardiogenic syncope, no episodes of  syncope or near syncope since device implantation. 2. PPM: Normal device function.  Very infrequent need for pacing.  Remote download every 3 months and yearly office visit. 3. HLP: Excellent lipid profile with the exception of borderline low HDL.  Encouraged more physical activity.  Her LDL cholesterol is actually quite low and I suspect that she would remain at an LDL less than 70 even if we stopped her Zetia.  We will wait until she has her follow-up labs in a few months with her PCP before making a decision on that. 4. Hypothyroidism: Recently started on levothyroxine supplementation.  No symptoms of hypothyroidism at this time.    Medication Adjustments/Labs and Tests Ordered: Current medicines are reviewed at length with the patient today.  Concerns regarding medicines are outlined above.  Medication changes, Labs and Tests  ordered today are listed in the Patient Instructions below. Patient Instructions  Medication Instructions:  No change If you need a refill on your cardiac medications before your next appointment, please call your pharmacy.   Lab work: NONE  Testing/Procedures: NONE  Follow-Up: At [facility], you and your health needs are our priority.  As part of our continuing mission to provide you with exceptional heart care, we have created designated Provider Care Teams.  These Care Teams include your primary Cardiologist (physician) and Advanced Practice Providers (APPs -  Physician Assistants and Nurse Practitioners) who all work together to provide you with the care you need, when you need it. You will need a follow up appointment in 12 months.  Please call our office 2 months in advance to schedule this appointment.  You may see Sanda Klein, MD .     Signed, Sanda Klein, MD  09/17/2018 3:52 PM    [facility] [address], [place], [place]  [address] Phone: [phone]; Fax: [phone]

## 2018-09-23 LAB — CUP PACEART INCLINIC DEVICE CHECK
Date Time Interrogation Session: 20200218152251
Implantable Lead Location: 753860
Implantable Lead Model: 5076
Implantable Pulse Generator Implant Date: 20100112
MDC IDC LEAD IMPLANT DT: 20100112
MDC IDC LEAD IMPLANT DT: 20100112
MDC IDC LEAD LOCATION: 753859

## 2018-09-25 ENCOUNTER — Telehealth: Payer: Self-pay

## 2018-09-25 NOTE — Telephone Encounter (Signed)
Left message for patient to remind of missed remote transmission.  

## 2018-10-03 ENCOUNTER — Encounter: Payer: Self-pay | Admitting: Cardiology

## 2018-12-30 ENCOUNTER — Ambulatory Visit (INDEPENDENT_AMBULATORY_CARE_PROVIDER_SITE_OTHER): Payer: PRIVATE HEALTH INSURANCE | Admitting: *Deleted

## 2018-12-30 DIAGNOSIS — I495 Sick sinus syndrome: Secondary | ICD-10-CM

## 2018-12-30 DIAGNOSIS — R001 Bradycardia, unspecified: Secondary | ICD-10-CM | POA: Diagnosis not present

## 2018-12-30 LAB — CUP PACEART REMOTE DEVICE CHECK
Battery Impedance: 912 Ohm
Battery Remaining Longevity: 81 mo
Battery Voltage: 2.78 V
Brady Statistic AP VP Percent: 0 %
Brady Statistic AP VS Percent: 3 %
Brady Statistic AS VP Percent: 0 %
Brady Statistic AS VS Percent: 97 %
Date Time Interrogation Session: 20200525224044
Implantable Lead Implant Date: 20100112
Implantable Lead Implant Date: 20100112
Implantable Lead Location: 753859
Implantable Lead Location: 753860
Implantable Lead Model: 5076
Implantable Lead Model: 5076
Implantable Pulse Generator Implant Date: 20100112
Lead Channel Impedance Value: 383 Ohm
Lead Channel Impedance Value: 536 Ohm
Lead Channel Pacing Threshold Amplitude: 0.5 V
Lead Channel Pacing Threshold Amplitude: 0.75 V
Lead Channel Pacing Threshold Pulse Width: 0.4 ms
Lead Channel Pacing Threshold Pulse Width: 0.4 ms
Lead Channel Setting Pacing Amplitude: 1.5 V
Lead Channel Setting Pacing Amplitude: 2 V
Lead Channel Setting Pacing Pulse Width: 0.4 ms
Lead Channel Setting Sensing Sensitivity: 5.6 mV

## 2019-01-09 NOTE — Progress Notes (Signed)
Remote pacemaker transmission.   

## 2019-01-09 NOTE — Progress Notes (Signed)
No letter.

## 2019-01-14 ENCOUNTER — Ambulatory Visit
Admission: RE | Admit: 2019-01-14 | Discharge: 2019-01-14 | Disposition: A | Discharge status: Home / Self Care | Payer: PRIVATE HEALTH INSURANCE | Source: Ambulatory Visit | Attending: Gynecology | Admitting: Gynecology

## 2019-01-14 ENCOUNTER — Other Ambulatory Visit: Payer: Self-pay

## 2019-01-14 ENCOUNTER — Other Ambulatory Visit: Payer: Self-pay | Admitting: Gynecology

## 2019-01-14 DIAGNOSIS — Z1231 Encounter for screening mammogram for malignant neoplasm of breast: Secondary | ICD-10-CM

## 2019-01-21 ENCOUNTER — Other Ambulatory Visit: Payer: Self-pay | Admitting: Internal Medicine

## 2019-01-21 DIAGNOSIS — Z780 Asymptomatic menopausal state: Secondary | ICD-10-CM

## 2019-03-04 ENCOUNTER — Other Ambulatory Visit: Payer: Self-pay

## 2019-03-04 ENCOUNTER — Ambulatory Visit
Admission: RE | Admit: 2019-03-04 | Discharge: 2019-03-04 | Disposition: A | Discharge status: Home / Self Care | Payer: PRIVATE HEALTH INSURANCE | Source: Ambulatory Visit | Attending: Internal Medicine | Admitting: Internal Medicine

## 2019-03-04 DIAGNOSIS — Z780 Asymptomatic menopausal state: Secondary | ICD-10-CM

## 2019-04-01 ENCOUNTER — Ambulatory Visit (INDEPENDENT_AMBULATORY_CARE_PROVIDER_SITE_OTHER): Payer: PRIVATE HEALTH INSURANCE | Admitting: *Deleted

## 2019-04-01 DIAGNOSIS — R001 Bradycardia, unspecified: Secondary | ICD-10-CM | POA: Diagnosis not present

## 2019-04-02 LAB — CUP PACEART REMOTE DEVICE CHECK
Battery Impedance: 937 Ohm
Battery Remaining Longevity: 80 mo
Battery Voltage: 2.77 V
Brady Statistic AP VP Percent: 0 %
Brady Statistic AP VS Percent: 3 %
Brady Statistic AS VP Percent: 0 %
Brady Statistic AS VS Percent: 97 %
Date Time Interrogation Session: 20200826233613
Implantable Lead Implant Date: 20100112
Implantable Lead Implant Date: 20100112
Implantable Lead Location: 753859
Implantable Lead Location: 753860
Implantable Lead Model: 5076
Implantable Lead Model: 5076
Implantable Pulse Generator Implant Date: 20100112
Lead Channel Impedance Value: 393 Ohm
Lead Channel Impedance Value: 489 Ohm
Lead Channel Pacing Threshold Amplitude: 0.5 V
Lead Channel Pacing Threshold Amplitude: 0.75 V
Lead Channel Pacing Threshold Pulse Width: 0.4 ms
Lead Channel Pacing Threshold Pulse Width: 0.4 ms
Lead Channel Setting Pacing Amplitude: 1.5 V
Lead Channel Setting Pacing Amplitude: 2 V
Lead Channel Setting Pacing Pulse Width: 0.4 ms
Lead Channel Setting Sensing Sensitivity: 5.6 mV

## 2019-04-10 ENCOUNTER — Encounter: Payer: Self-pay | Admitting: Cardiology

## 2019-04-10 NOTE — Progress Notes (Signed)
Remote pacemaker transmission.   

## 2019-07-01 ENCOUNTER — Ambulatory Visit (INDEPENDENT_AMBULATORY_CARE_PROVIDER_SITE_OTHER): Payer: PRIVATE HEALTH INSURANCE | Admitting: *Deleted

## 2019-07-01 DIAGNOSIS — I469 Cardiac arrest, cause unspecified: Secondary | ICD-10-CM | POA: Diagnosis not present

## 2019-07-02 LAB — CUP PACEART REMOTE DEVICE CHECK
Battery Impedance: 1014 Ohm
Battery Remaining Longevity: 76 mo
Battery Voltage: 2.77 V
Brady Statistic AP VP Percent: 0 %
Brady Statistic AP VS Percent: 3 %
Brady Statistic AS VP Percent: 0 %
Brady Statistic AS VS Percent: 97 %
Date Time Interrogation Session: 20201125194802
Implantable Lead Implant Date: 20100112
Implantable Lead Implant Date: 20100112
Implantable Lead Location: 753859
Implantable Lead Location: 753860
Implantable Lead Model: 5076
Implantable Lead Model: 5076
Implantable Pulse Generator Implant Date: 20100112
Lead Channel Impedance Value: 393 Ohm
Lead Channel Impedance Value: 485 Ohm
Lead Channel Pacing Threshold Amplitude: 0.5 V
Lead Channel Pacing Threshold Amplitude: 0.75 V
Lead Channel Pacing Threshold Pulse Width: 0.4 ms
Lead Channel Pacing Threshold Pulse Width: 0.4 ms
Lead Channel Sensing Intrinsic Amplitude: 16 mV
Lead Channel Sensing Intrinsic Amplitude: 2.8 mV
Lead Channel Setting Pacing Amplitude: 1.5 V
Lead Channel Setting Pacing Amplitude: 2 V
Lead Channel Setting Pacing Pulse Width: 0.4 ms
Lead Channel Setting Sensing Sensitivity: 5.6 mV

## 2019-09-28 ENCOUNTER — Telehealth: Payer: Self-pay

## 2019-09-28 ENCOUNTER — Ambulatory Visit: Payer: Managed Care, Other (non HMO) | Admitting: Cardiovascular Disease

## 2019-09-28 ENCOUNTER — Encounter: Payer: Self-pay | Admitting: Cardiovascular Disease

## 2019-09-28 ENCOUNTER — Other Ambulatory Visit: Payer: Self-pay

## 2019-09-28 VITALS — BP 142/72 | HR 65 | Ht 61.5 in | Wt 146.0 lb

## 2019-09-28 DIAGNOSIS — Z950 Presence of cardiac pacemaker: Secondary | ICD-10-CM

## 2019-09-28 DIAGNOSIS — E039 Hypothyroidism, unspecified: Secondary | ICD-10-CM | POA: Diagnosis not present

## 2019-09-28 DIAGNOSIS — E7800 Pure hypercholesterolemia, unspecified: Secondary | ICD-10-CM | POA: Diagnosis not present

## 2019-09-28 DIAGNOSIS — R55 Syncope and collapse: Secondary | ICD-10-CM

## 2019-09-28 NOTE — Progress Notes (Signed)
Cardiology Office Note    Date:  09/28/2019   ID:  Tomasa, Heisser 11-04-1950, MRN [id number]  PCP:  Deland Pretty, MD  Cardiologist:   Sanda Klein, MD   Chief complaint: Follow-up neurocardiogenic syncope, pacemaker check, hyperlipidemia   History of Present Illness:  Sherry Davis is a 69 y.o. female with history of neurocardiogenic syncope and with cardioinhibitory component for which received a pacemaker in 2010 also followed for hyperlipidemia.  She has no CV complaints.The patient specifically denies any chest pain at rest exertion, dyspnea at rest or with exertion, orthopnea, paroxysmal nocturnal dyspnea, syncope, palpitations, focal neurological deficits, intermittent claudication, lower extremity edema, unexplained weight gain, cough, hemoptysis or wheezing.  Pacemaker interrogation shows normal device function.  She has 26% atrial pacing and <0.4% ventricular pacing. Battery longevity is estimated at 6 years. Lead parameters and heart rate histogram are normal.  There have been no episodes of mode switch since her last download.  Labs including lipid profile has been followed by Dr. Shelia Media.  The most recent labs from May 2020 showed total cholesterol 128, HDL 46, LDL 63, triglycerides 96, creatinine 0.9, hemoglobin 14.1, TSH 1.41 on levothyroxine .   Past Medical History:  Diagnosis Date  . Dyslipidemia   . GERD (gastroesophageal reflux disease)   . Presence of permanent cardiac pacemaker 08/17/2008   Medtroic Adapta  . 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 Syncope     Past Surgical History:  Procedure Laterality Date  . BREAST EXCISIONAL BIOPSY Right 2000  . BREAST LUMPECTOMY    . COLONOSCOPY  02/23/2015  . DILATION AND CURETTAGE OF UTERUS    . PERMANENT PACEMAKER INSERTION  08/17/2008   Medtronic adapta    Current Medications: Outpatient Medications Prior to Visit  Medication Sig Dispense Refill  . ezetimibe (ZETIA) 10 MG tablet  Take by mouth daily. Pt takes 5mg  daily    . levothyroxine (SYNTHROID, LEVOTHROID) 50 MCG tablet daily.    . rosuvastatin (CRESTOR) 20 MG tablet Take 20 mg by mouth daily.    Marland Kitchen ezetimibe (ZETIA) 10 MG tablet Take 10 mg by mouth daily.     No facility-administered medications prior to visit.     Allergies:   Penicillins   Social History   Socioeconomic History  . Marital status: Married    Spouse name: Not on file  . Number of children: Not on file  . Years of education: Not on file  . Highest education level: Not on file  Occupational History  . Not on file  Tobacco Use  . Smoking status: Former Smoker    Types: Cigarettes  . Smokeless tobacco: Never Used  Substance and Sexual Activity  . Alcohol use: No    Alcohol/week: 0.0 standard drinks  . Drug use: No  . Sexual activity: Not on file  Other Topics Concern  . Not on file  Social History Narrative  . Not on file   Social Determinants of Health   Financial Resource Strain:   . Difficulty of Paying Living Expenses: Not on file  Food Insecurity:   . Worried About Charity fundraiser in the Last Year: Not on file  . Ran Out of Food in the Last Year: Not on file  Transportation Needs:   . Lack of Transportation (Medical): Not on file  . Lack of Transportation (Non-Medical): Not on file  Physical Activity:   . Days of Exercise per Week: Not on file  . Minutes of  Exercise per Session: Not on file  Stress:   . Feeling of Stress : Not on file  Social Connections:   . Frequency of Communication with Friends and Family: Not on file  . Frequency of Social Gatherings with Friends and Family: Not on file  . Attends Religious Services: Not on file  . Active Member of Clubs or Organizations: Not on file  . Attends Archivist Meetings: Not on file  . Marital Status: Not on file     Family History:  The patient's family history includes Hyperlipidemia in her father and mother; Hypertension in her brother and  mother.   ROS:   Please see the history of present illness.    ROS All other systems are reviewed and are negative.  PHYSICAL EXAM:   VS:  BP (!) 142/72   Pulse 65   Ht 5' 1.5" (1.562 m)   Wt 146 lb (66.2 kg)   BMI 27.14 kg/m     General: Alert, oriented x3, no distress, healthy L subclavian PM site Head: no evidence of trauma, PERRL, EOMI, no exophtalmos or lid lag, no myxedema, no xanthelasma; normal ears, nose and oropharynx Neck: normal jugular venous pulsations and no hepatojugular reflux; brisk carotid pulses without delay and no carotid bruits Chest: clear to auscultation, no signs of consolidation by percussion or palpation, normal fremitus, symmetrical and full respiratory excursions Cardiovascular: normal position and quality of the apical impulse, regular rhythm, normal first and second heart sounds, no murmurs, rubs or gallops Abdomen: no tenderness or distention, no masses by palpation, no abnormal pulsatility or arterial bruits, normal bowel sounds, no hepatosplenomegaly Extremities: no clubbing, cyanosis or edema; 2+ radial, ulnar and brachial pulses bilaterally; 2+ right femoral, posterior tibial and dorsalis pedis pulses; 2+ left femoral, posterior tibial and dorsalis pedis pulses; no subclavian or femoral bruits Neurological: grossly nonfocal Psych: Normal mood and affect   Wt Readings from Last 3 Encounters:  09/28/19 146 lb (66.2 kg)  09/17/18 148 lb (67.1 kg)  09/25/17 145 lb (65.8 kg)      Studies/Labs Reviewed:   EKG:  EKG is ordered today and shows NSR, normal tracing.  Recent Labs: 12/23/2018 total cholesterol 128, HDL 46, LDL 63, triglycerides 96, creatinine 0.9, hemoglobin 14.1, TSH 1.41   ASSESSMENT:    1. Neurocardiogenic syncope   2. Pacemaker   3. Pure hypercholesterolemia   4. Acquired hypothyroidism      PLAN:  In order of problems listed above:  1. Neurocardiogenic syncope:  No syncope since device insertion. 2. PPM: Normal  function, continue downloads Q3 mos. 3. HLP: all lipid parameters are in desirable range. 4. Hypothyroidism: clinically and biochemically compensated on L-T4 supplement.    Medication Adjustments/Labs and Tests Ordered: Current medicines are reviewed at length with the patient today.  Concerns regarding medicines are outlined above.  Medication changes, Labs and Tests ordered today are listed in the Patient Instructions below. Patient Instructions  Medication Instructions:  No changes *If you need a refill on your cardiac medications before your next appointment, please call your pharmacy*  Lab Work: None ordered If you have labs (blood work) drawn today and your tests are completely normal, you will receive your results only by: Marland Kitchen MyChart Message (if you have MyChart) OR . A paper copy in the mail If you have any lab test that is abnormal or we need to change your treatment, we will call you to review the results.  Testing/Procedures: None ordered  Follow-Up: At [facility], you and your health needs are our priority.  As part of our continuing mission to provide you with exceptional heart care, we have created designated Provider Care Teams.  These Care Teams include your primary Cardiologist (physician) and Advanced Practice Providers (APPs -  Physician Assistants and Nurse Practitioners) who all work together to provide you with the care you need, when you need it.  Your next appointment:   12 month(s)  The format for your next appointment:   In Person  Provider:   Sanda Klein, MD      Signed, Sanda Klein, MD  09/28/2019 [facility] [address] [place], [place], [place]  [address] Phone: [phone]; Fax: [phone]

## 2019-09-28 NOTE — Patient Instructions (Signed)

## 2019-09-28 NOTE — Telephone Encounter (Signed)
DPR on file, spoke with pt spouse Percell Miller.  Propvided phone # for [employer] monitor support line for pt to contact to determine if her device is compatible with something else.

## 2019-09-28 NOTE — Telephone Encounter (Signed)
-----   Message from Sanda Klein, MD sent at 09/28/2019  8:31 AM EST ----- Pacer check in clinic today. She was asking if there is any new transmitter she could use, since the wire-x adapter often has weak signal.

## 2019-10-08 ENCOUNTER — Other Ambulatory Visit: Payer: Self-pay | Admitting: Cardiovascular Disease

## 2019-11-20 ENCOUNTER — Telehealth: Payer: Self-pay

## 2019-11-20 NOTE — Telephone Encounter (Signed)
Pt states she is not sending another transmission until Dec 30, 2019.

## 2019-12-30 ENCOUNTER — Ambulatory Visit (INDEPENDENT_AMBULATORY_CARE_PROVIDER_SITE_OTHER): Payer: Managed Care, Other (non HMO) | Admitting: *Deleted

## 2019-12-30 DIAGNOSIS — I469 Cardiac arrest, cause unspecified: Secondary | ICD-10-CM

## 2019-12-31 LAB — CUP PACEART REMOTE DEVICE CHECK
Battery Impedance: 1144 Ohm
Battery Remaining Longevity: 71 mo
Battery Voltage: 2.77 V
Brady Statistic AP VP Percent: 0 %
Brady Statistic AP VS Percent: 3 %
Brady Statistic AS VP Percent: 0 %
Brady Statistic AS VS Percent: 97 %
Date Time Interrogation Session: 20210526193549
Implantable Lead Implant Date: 20100112
Implantable Lead Implant Date: 20100112
Implantable Lead Location: 753859
Implantable Lead Location: 753860
Implantable Lead Model: 5076
Implantable Lead Model: 5076
Implantable Pulse Generator Implant Date: 20100112
Lead Channel Impedance Value: 414 Ohm
Lead Channel Impedance Value: 551 Ohm
Lead Channel Pacing Threshold Amplitude: 0.5 V
Lead Channel Pacing Threshold Amplitude: 0.75 V
Lead Channel Pacing Threshold Pulse Width: 0.4 ms
Lead Channel Pacing Threshold Pulse Width: 0.4 ms
Lead Channel Setting Pacing Amplitude: 1.5 V
Lead Channel Setting Pacing Amplitude: 2 V
Lead Channel Setting Pacing Pulse Width: 0.4 ms
Lead Channel Setting Sensing Sensitivity: 5.6 mV

## 2019-12-31 NOTE — Progress Notes (Signed)
Remote pacemaker transmission.   

## 2020-02-04 ENCOUNTER — Ambulatory Visit
Admission: RE | Admit: 2020-02-04 | Discharge: 2020-02-04 | Disposition: A | Discharge status: Home / Self Care | Payer: Managed Care, Other (non HMO) | Source: Ambulatory Visit | Attending: Gynecology | Admitting: Gynecology

## 2020-02-04 ENCOUNTER — Other Ambulatory Visit: Payer: Self-pay | Admitting: Gynecology

## 2020-02-04 ENCOUNTER — Other Ambulatory Visit: Payer: Self-pay

## 2020-02-04 DIAGNOSIS — R928 Other abnormal and inconclusive findings on diagnostic imaging of breast: Secondary | ICD-10-CM

## 2020-02-04 DIAGNOSIS — Z1231 Encounter for screening mammogram for malignant neoplasm of breast: Secondary | ICD-10-CM

## 2020-02-09 ENCOUNTER — Ambulatory Visit: Payer: Managed Care, Other (non HMO)

## 2020-02-16 ENCOUNTER — Other Ambulatory Visit: Payer: Self-pay | Admitting: Endocrinology

## 2020-02-16 DIAGNOSIS — E041 Nontoxic single thyroid nodule: Secondary | ICD-10-CM

## 2020-03-21 ENCOUNTER — Ambulatory Visit
Admission: RE | Admit: 2020-03-21 | Discharge: 2020-03-21 | Disposition: A | Discharge status: Home / Self Care | Payer: Managed Care, Other (non HMO) | Source: Ambulatory Visit | Attending: Endocrinology | Admitting: Endocrinology

## 2020-03-21 DIAGNOSIS — E041 Nontoxic single thyroid nodule: Secondary | ICD-10-CM

## 2020-03-30 ENCOUNTER — Ambulatory Visit (INDEPENDENT_AMBULATORY_CARE_PROVIDER_SITE_OTHER): Payer: Managed Care, Other (non HMO) | Admitting: *Deleted

## 2020-03-30 DIAGNOSIS — I469 Cardiac arrest, cause unspecified: Secondary | ICD-10-CM | POA: Diagnosis not present

## 2020-03-31 LAB — CUP PACEART REMOTE DEVICE CHECK
Battery Impedance: 1250 Ohm
Battery Remaining Longevity: 66 mo
Battery Voltage: 2.78 V
Brady Statistic AP VP Percent: 0 %
Brady Statistic AP VS Percent: 3 %
Brady Statistic AS VP Percent: 0 %
Brady Statistic AS VS Percent: 97 %
Date Time Interrogation Session: 20210825212330
Implantable Lead Implant Date: 20100112
Implantable Lead Implant Date: 20100112
Implantable Lead Location: 753859
Implantable Lead Location: 753860
Implantable Lead Model: 5076
Implantable Lead Model: 5076
Implantable Pulse Generator Implant Date: 20100112
Lead Channel Impedance Value: 403 Ohm
Lead Channel Impedance Value: 523 Ohm
Lead Channel Pacing Threshold Amplitude: 0.5 V
Lead Channel Pacing Threshold Amplitude: 0.75 V
Lead Channel Pacing Threshold Pulse Width: 0.4 ms
Lead Channel Pacing Threshold Pulse Width: 0.4 ms
Lead Channel Setting Pacing Amplitude: 1.5 V
Lead Channel Setting Pacing Amplitude: 2 V
Lead Channel Setting Pacing Pulse Width: 0.4 ms
Lead Channel Setting Sensing Sensitivity: 5.6 mV

## 2020-04-05 NOTE — Progress Notes (Signed)
Remote pacemaker transmission.   

## 2020-06-29 ENCOUNTER — Ambulatory Visit (INDEPENDENT_AMBULATORY_CARE_PROVIDER_SITE_OTHER): Payer: Managed Care, Other (non HMO)

## 2020-06-29 DIAGNOSIS — I495 Sick sinus syndrome: Secondary | ICD-10-CM

## 2020-07-01 LAB — CUP PACEART REMOTE DEVICE CHECK
Battery Impedance: 1303 Ohm
Battery Remaining Longevity: 64 mo
Battery Voltage: 2.77 V
Brady Statistic AP VP Percent: 0 %
Brady Statistic AP VS Percent: 3 %
Brady Statistic AS VP Percent: 0 %
Brady Statistic AS VS Percent: 97 %
Date Time Interrogation Session: 20211124205603
Implantable Lead Implant Date: 20100112
Implantable Lead Implant Date: 20100112
Implantable Lead Location: 753859
Implantable Lead Location: 753860
Implantable Lead Model: 5076
Implantable Lead Model: 5076
Implantable Pulse Generator Implant Date: 20100112
Lead Channel Impedance Value: 398 Ohm
Lead Channel Impedance Value: 498 Ohm
Lead Channel Pacing Threshold Amplitude: 0.5 V
Lead Channel Pacing Threshold Amplitude: 0.75 V
Lead Channel Pacing Threshold Pulse Width: 0.4 ms
Lead Channel Pacing Threshold Pulse Width: 0.4 ms
Lead Channel Setting Pacing Amplitude: 1.5 V
Lead Channel Setting Pacing Amplitude: 2 V
Lead Channel Setting Pacing Pulse Width: 0.4 ms
Lead Channel Setting Sensing Sensitivity: 5.6 mV

## 2020-07-07 NOTE — Progress Notes (Signed)
Remote pacemaker transmission.   

## 2020-08-07 ENCOUNTER — Encounter (HOSPITAL_COMMUNITY): Payer: Self-pay | Admitting: Emergency Medicine

## 2020-08-07 ENCOUNTER — Other Ambulatory Visit: Payer: Self-pay

## 2020-08-07 DIAGNOSIS — M542 Cervicalgia: Secondary | ICD-10-CM | POA: Insufficient documentation

## 2020-08-07 DIAGNOSIS — M25511 Pain in right shoulder: Secondary | ICD-10-CM | POA: Diagnosis not present

## 2020-08-07 DIAGNOSIS — M25512 Pain in left shoulder: Secondary | ICD-10-CM | POA: Insufficient documentation

## 2020-08-07 DIAGNOSIS — Z5321 Procedure and treatment not carried out due to patient leaving prior to being seen by health care provider: Secondary | ICD-10-CM | POA: Insufficient documentation
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
## 2020-08-08 ENCOUNTER — Emergency Department (HOSPITAL_COMMUNITY)
Admission: EM | Admit: 2020-08-08 | Discharge: 2020-08-08 | Disposition: A | Discharge status: Home / Self Care | Payer: Managed Care, Other (non HMO) | Attending: Emergency Medicine | Admitting: Emergency Medicine

## 2020-09-28 ENCOUNTER — Ambulatory Visit (INDEPENDENT_AMBULATORY_CARE_PROVIDER_SITE_OTHER): Payer: Managed Care, Other (non HMO)

## 2020-09-28 DIAGNOSIS — I495 Sick sinus syndrome: Secondary | ICD-10-CM

## 2020-09-29 LAB — CUP PACEART REMOTE DEVICE CHECK
Battery Impedance: 1330 Ohm
Battery Remaining Longevity: 63 mo
Battery Voltage: 2.77 V
Brady Statistic AP VP Percent: 0 %
Brady Statistic AP VS Percent: 3 %
Brady Statistic AS VP Percent: 0 %
Brady Statistic AS VS Percent: 97 %
Date Time Interrogation Session: 20220223195118
Implantable Lead Implant Date: 20100112
Implantable Lead Implant Date: 20100112
Implantable Lead Location: 753859
Implantable Lead Location: 753860
Implantable Lead Model: 5076
Implantable Lead Model: 5076
Implantable Pulse Generator Implant Date: 20100112
Lead Channel Impedance Value: 394 Ohm
Lead Channel Impedance Value: 507 Ohm
Lead Channel Pacing Threshold Amplitude: 0.5 V
Lead Channel Pacing Threshold Amplitude: 0.875 V
Lead Channel Pacing Threshold Pulse Width: 0.4 ms
Lead Channel Pacing Threshold Pulse Width: 0.4 ms
Lead Channel Setting Pacing Amplitude: 1.5 V
Lead Channel Setting Pacing Amplitude: 2 V
Lead Channel Setting Pacing Pulse Width: 0.4 ms
Lead Channel Setting Sensing Sensitivity: 5.6 mV

## 2020-10-06 NOTE — Progress Notes (Signed)
Remote pacemaker transmission.   

## 2020-12-05 ENCOUNTER — Ambulatory Visit (INDEPENDENT_AMBULATORY_CARE_PROVIDER_SITE_OTHER): Payer: Managed Care, Other (non HMO) | Admitting: Cardiovascular Disease

## 2020-12-05 ENCOUNTER — Other Ambulatory Visit: Payer: Self-pay

## 2020-12-05 ENCOUNTER — Encounter: Payer: Self-pay | Admitting: Cardiovascular Disease

## 2020-12-05 VITALS — BP 142/68 | HR 71 | Ht 61.5 in | Wt 143.4 lb

## 2020-12-05 DIAGNOSIS — E039 Hypothyroidism, unspecified: Secondary | ICD-10-CM

## 2020-12-05 DIAGNOSIS — E7800 Pure hypercholesterolemia, unspecified: Secondary | ICD-10-CM

## 2020-12-05 DIAGNOSIS — R55 Syncope and collapse: Secondary | ICD-10-CM | POA: Diagnosis not present

## 2020-12-05 DIAGNOSIS — Z950 Presence of cardiac pacemaker: Secondary | ICD-10-CM | POA: Diagnosis not present

## 2020-12-05 NOTE — Patient Instructions (Signed)
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
## 2020-12-28 ENCOUNTER — Ambulatory Visit (INDEPENDENT_AMBULATORY_CARE_PROVIDER_SITE_OTHER): Payer: Managed Care, Other (non HMO)

## 2020-12-28 DIAGNOSIS — I495 Sick sinus syndrome: Secondary | ICD-10-CM

## 2020-12-30 LAB — CUP PACEART REMOTE DEVICE CHECK
Battery Impedance: 1471 Ohm
Battery Remaining Longevity: 59 mo
Battery Voltage: 2.77 V
Brady Statistic AP VP Percent: 0 %
Brady Statistic AP VS Percent: 3 %
Brady Statistic AS VP Percent: 0 %
Brady Statistic AS VS Percent: 97 %
Date Time Interrogation Session: 20220526190230
Implantable Lead Implant Date: 20100112
Implantable Lead Implant Date: 20100112
Implantable Lead Location: 753859
Implantable Lead Location: 753860
Implantable Lead Model: 5076
Implantable Lead Model: 5076
Implantable Pulse Generator Implant Date: 20100112
Lead Channel Impedance Value: 404 Ohm
Lead Channel Impedance Value: 538 Ohm
Lead Channel Pacing Threshold Amplitude: 0.5 V
Lead Channel Pacing Threshold Amplitude: 0.875 V
Lead Channel Pacing Threshold Pulse Width: 0.4 ms
Lead Channel Pacing Threshold Pulse Width: 0.4 ms
Lead Channel Setting Pacing Amplitude: 1.5 V
Lead Channel Setting Pacing Amplitude: 2 V
Lead Channel Setting Pacing Pulse Width: 0.4 ms
Lead Channel Setting Sensing Sensitivity: 5.6 mV

## 2021-01-19 ENCOUNTER — Other Ambulatory Visit (HOSPITAL_COMMUNITY): Payer: Self-pay | Admitting: Endocrinology

## 2021-01-19 ENCOUNTER — Other Ambulatory Visit: Payer: Self-pay | Admitting: Endocrinology

## 2021-01-19 DIAGNOSIS — E210 Primary hyperparathyroidism: Secondary | ICD-10-CM

## 2021-01-23 ENCOUNTER — Other Ambulatory Visit: Payer: Self-pay | Admitting: Endocrinology

## 2021-01-23 DIAGNOSIS — E210 Primary hyperparathyroidism: Secondary | ICD-10-CM

## 2021-01-23 DIAGNOSIS — M810 Age-related osteoporosis without current pathological fracture: Secondary | ICD-10-CM

## 2021-01-23 NOTE — Progress Notes (Signed)
Remote pacemaker transmission.   

## 2021-02-10 ENCOUNTER — Other Ambulatory Visit: Payer: Self-pay | Admitting: Gynecology

## 2021-02-10 DIAGNOSIS — Z1231 Encounter for screening mammogram for malignant neoplasm of breast: Secondary | ICD-10-CM

## 2021-02-15 ENCOUNTER — Other Ambulatory Visit: Payer: Self-pay

## 2021-02-15 ENCOUNTER — Ambulatory Visit
Admission: RE | Admit: 2021-02-15 | Discharge: 2021-02-15 | Disposition: A | Discharge status: Home / Self Care | Payer: Managed Care, Other (non HMO) | Source: Ambulatory Visit | Attending: Gynecology | Admitting: Gynecology

## 2021-02-15 DIAGNOSIS — Z1231 Encounter for screening mammogram for malignant neoplasm of breast: Secondary | ICD-10-CM

## 2021-03-06 ENCOUNTER — Other Ambulatory Visit: Payer: Self-pay

## 2021-03-06 ENCOUNTER — Ambulatory Visit
Admission: RE | Admit: 2021-03-06 | Discharge: 2021-03-06 | Disposition: A | Discharge status: Home / Self Care | Payer: Managed Care, Other (non HMO) | Source: Ambulatory Visit | Attending: Endocrinology | Admitting: Endocrinology

## 2021-03-06 DIAGNOSIS — E210 Primary hyperparathyroidism: Secondary | ICD-10-CM

## 2021-03-06 DIAGNOSIS — M810 Age-related osteoporosis without current pathological fracture: Secondary | ICD-10-CM

## 2021-03-10 ENCOUNTER — Other Ambulatory Visit (HOSPITAL_COMMUNITY): Payer: Managed Care, Other (non HMO)

## 2021-03-10 ENCOUNTER — Ambulatory Visit (HOSPITAL_COMMUNITY): Payer: Managed Care, Other (non HMO)

## 2021-03-10 ENCOUNTER — Encounter (HOSPITAL_COMMUNITY): Payer: Self-pay

## 2021-03-29 ENCOUNTER — Ambulatory Visit (INDEPENDENT_AMBULATORY_CARE_PROVIDER_SITE_OTHER): Payer: Managed Care, Other (non HMO)

## 2021-03-29 DIAGNOSIS — I495 Sick sinus syndrome: Secondary | ICD-10-CM

## 2021-03-31 LAB — CUP PACEART REMOTE DEVICE CHECK
Battery Impedance: 1548 Ohm
Battery Remaining Longevity: 56 mo
Battery Voltage: 2.76 V
Brady Statistic AP VP Percent: 0 %
Brady Statistic AP VS Percent: 3 %
Brady Statistic AS VP Percent: 0 %
Brady Statistic AS VS Percent: 96 %
Date Time Interrogation Session: 20220825163444
Implantable Lead Implant Date: 20100112
Implantable Lead Implant Date: 20100112
Implantable Lead Location: 753859
Implantable Lead Location: 753860
Implantable Lead Model: 5076
Implantable Lead Model: 5076
Implantable Pulse Generator Implant Date: 20100112
Lead Channel Impedance Value: 399 Ohm
Lead Channel Impedance Value: 526 Ohm
Lead Channel Pacing Threshold Amplitude: 0.5 V
Lead Channel Pacing Threshold Amplitude: 0.875 V
Lead Channel Pacing Threshold Pulse Width: 0.4 ms
Lead Channel Pacing Threshold Pulse Width: 0.4 ms
Lead Channel Setting Pacing Amplitude: 1.5 V
Lead Channel Setting Pacing Amplitude: 2 V
Lead Channel Setting Pacing Pulse Width: 0.4 ms
Lead Channel Setting Sensing Sensitivity: 5.6 mV

## 2021-04-13 NOTE — Progress Notes (Signed)
Remote pacemaker transmission.   

## 2021-06-28 ENCOUNTER — Ambulatory Visit (INDEPENDENT_AMBULATORY_CARE_PROVIDER_SITE_OTHER): Payer: Managed Care, Other (non HMO)

## 2021-06-28 DIAGNOSIS — I495 Sick sinus syndrome: Secondary | ICD-10-CM

## 2021-06-28 LAB — CUP PACEART REMOTE DEVICE CHECK
Battery Impedance: 1573 Ohm
Battery Remaining Longevity: 56 mo
Battery Voltage: 2.76 V
Brady Statistic AP VP Percent: 0 %
Brady Statistic AP VS Percent: 4 %
Brady Statistic AS VP Percent: 0 %
Brady Statistic AS VS Percent: 96 %
Date Time Interrogation Session: 20221123105514
Implantable Lead Implant Date: 20100112
Implantable Lead Implant Date: 20100112
Implantable Lead Location: 753859
Implantable Lead Location: 753860
Implantable Lead Model: 5076
Implantable Lead Model: 5076
Implantable Pulse Generator Implant Date: 20100112
Lead Channel Impedance Value: 432 Ohm
Lead Channel Impedance Value: 562 Ohm
Lead Channel Pacing Threshold Amplitude: 0.5 V
Lead Channel Pacing Threshold Amplitude: 0.75 V
Lead Channel Pacing Threshold Pulse Width: 0.4 ms
Lead Channel Pacing Threshold Pulse Width: 0.4 ms
Lead Channel Setting Pacing Amplitude: 1.5 V
Lead Channel Setting Pacing Amplitude: 2 V
Lead Channel Setting Pacing Pulse Width: 0.4 ms
Lead Channel Setting Sensing Sensitivity: 5.6 mV

## 2021-07-10 NOTE — Progress Notes (Signed)
Remote pacemaker transmission.   

## 2021-09-27 ENCOUNTER — Ambulatory Visit (INDEPENDENT_AMBULATORY_CARE_PROVIDER_SITE_OTHER): Payer: Managed Care, Other (non HMO)

## 2021-09-27 DIAGNOSIS — I495 Sick sinus syndrome: Secondary | ICD-10-CM

## 2021-09-29 LAB — CUP PACEART REMOTE DEVICE CHECK
Battery Impedance: 1659 Ohm
Battery Remaining Longevity: 53 mo
Battery Voltage: 2.76 V
Brady Statistic AP VP Percent: 0 %
Brady Statistic AP VS Percent: 4 %
Brady Statistic AS VP Percent: 0 %
Brady Statistic AS VS Percent: 96 %
Date Time Interrogation Session: 20230223162631
Implantable Lead Implant Date: 20100112
Implantable Lead Implant Date: 20100112
Implantable Lead Location: 753859
Implantable Lead Location: 753860
Implantable Lead Model: 5076
Implantable Lead Model: 5076
Implantable Pulse Generator Implant Date: 20100112
Lead Channel Impedance Value: 405 Ohm
Lead Channel Impedance Value: 510 Ohm
Lead Channel Pacing Threshold Amplitude: 0.5 V
Lead Channel Pacing Threshold Amplitude: 0.875 V
Lead Channel Pacing Threshold Pulse Width: 0.4 ms
Lead Channel Pacing Threshold Pulse Width: 0.4 ms
Lead Channel Setting Pacing Amplitude: 1.5 V
Lead Channel Setting Pacing Amplitude: 2 V
Lead Channel Setting Pacing Pulse Width: 0.4 ms
Lead Channel Setting Sensing Sensitivity: 5.6 mV

## 2021-10-04 NOTE — Progress Notes (Signed)
Remote pacemaker transmission.   

## 2021-12-04 ENCOUNTER — Encounter: Payer: Self-pay | Admitting: Cardiovascular Disease

## 2021-12-04 ENCOUNTER — Ambulatory Visit: Payer: Managed Care, Other (non HMO) | Admitting: Cardiovascular Disease

## 2021-12-04 VITALS — BP 140/62 | HR 70 | Ht 61.0 in | Wt 144.2 lb

## 2021-12-04 DIAGNOSIS — R55 Syncope and collapse: Secondary | ICD-10-CM

## 2021-12-04 DIAGNOSIS — Z950 Presence of cardiac pacemaker: Secondary | ICD-10-CM | POA: Diagnosis not present

## 2021-12-04 DIAGNOSIS — E785 Hyperlipidemia, unspecified: Secondary | ICD-10-CM

## 2021-12-04 DIAGNOSIS — E039 Hypothyroidism, unspecified: Secondary | ICD-10-CM

## 2021-12-04 NOTE — Progress Notes (Signed)
? ?Cardiology Office Note   ? ?Date:  12/04/2021  ? ?ID:  Sherry Davis, DOB 1951/06/17, MRN [id number] ? ?PCP:  Deland Pretty, MD  ?Cardiologist:   Sanda Klein, MD  ? ?Chief complaint: Follow-up neurocardiogenic syncope, pacemaker check, hyperlipidemia ? ? ?History of Present Illness:  ?Sherry Davis is a 71 y.o. female with history of neurocardiogenic syncope and with cardioinhibitory component for which received a pacemaker in 2010 also followed for hyperlipidemia.  She has been having issues with hypercalcemia the labs suggest that she has primary hyperparathyroidism.  She is to see Dr. Harlow Asa to discuss possible surgery.  Her endocrinologist is Dr. Chalmers Cater. ? ?She had an uneventful year from a cardiovascular point of view.  She had 1 near syncopal event while taking a warm shower, during a period of intestinal upset.  She has not had full-blown syncope. ? ?The patient specifically denies any chest pain at rest exertion, dyspnea at rest or with exertion, orthopnea, paroxysmal nocturnal dyspnea, syncope, palpitations, focal neurological deficits, intermittent claudication, lower extremity edema, unexplained weight gain, cough, hemoptysis or wheezing. ? ?Interrogation of her pacemaker shows normal device function with approximately 5 years left on the generator (Medtronic Adapta 2010.  She has 3.6% atrial pacing and 0.3% ventricular pacing.  She has had a handful of extremely brief episodes of atrial mode switch and no episodes of high ventricular rate. ? ?Her most recent labs from almost exactly a year ago showed acceptable lipid profile with total cholesterol 135, HDL 43, LDL 76, triglycerides 108.   TSH was in normal range on levothyroxine.   ? ? ?Past Medical History:  ?Diagnosis Date  ? Dyslipidemia   ? GERD (gastroesophageal reflux disease)   ? Presence of permanent cardiac pacemaker 08/17/2008  ? Medtroic Adapta  ? Sinoatrial arrest with nodal/ventricular escape ([place])   ? Syncope   ? ? ?Past Surgical  History:  ?Procedure Laterality Date  ? BREAST EXCISIONAL BIOPSY Right 2000  ? BREAST LUMPECTOMY    ? COLONOSCOPY  02/23/2015  ? DILATION AND CURETTAGE OF UTERUS    ? PERMANENT PACEMAKER INSERTION  08/17/2008  ? Medtronic adapta  ? ? ?Current Medications: ?Outpatient Medications Prior to Visit  ?Medication Sig Dispense Refill  ? ezetimibe (ZETIA) 10 MG tablet Take by mouth daily. Pt takes '5mg'$  daily    ? levothyroxine (SYNTHROID, LEVOTHROID) 50 MCG tablet daily.    ? rosuvastatin (CRESTOR) 20 MG tablet Take 20 mg by mouth daily.    ? Cholecalciferol (VITAMIN D3 HIGH POTENCY PO)  (Patient not taking: Reported on 12/04/2021)    ? cyanocobalamin 1000 MCG tablet Take 1,000 mcg by mouth daily. (Patient not taking: Reported on 12/04/2021)    ? vitamin C (ASCORBIC ACID) 500 MG tablet Take 500 mg by mouth daily. (Patient not taking: Reported on 12/04/2021)    ? ?No facility-administered medications prior to visit.  ?  ? ?Allergies:   Penicillins  ? ?Social History  ? ?Socioeconomic History  ? Marital status: Married  ?  Spouse name: Not on file  ? Number of children: Not on file  ? Years of education: Not on file  ? Highest education level: Not on file  ?Occupational History  ? Not on file  ?Tobacco Use  ? Smoking status: Former  ?  Types: Cigarettes  ? Smokeless tobacco: Never  ?Substance and Sexual Activity  ? Alcohol use: No  ?  Alcohol/week: 0.0 standard drinks  ? Drug use: No  ? Sexual activity: Not on  file  ?Other Topics Concern  ? Not on file  ?Social History Narrative  ? Not on file  ? ?Social Determinants of Health  ? ?Financial Resource Strain: Not on file  ?Food Insecurity: Not on file  ?Transportation Needs: Not on file  ?Physical Activity: Not on file  ?Stress: Not on file  ?Social Connections: Not on file  ?  ? ?Family History:  The patient's family history includes Hyperlipidemia in her father and mother; Hypertension in her brother and mother.  ? ?ROS:   ?Please see the history of present illness.    ?ROS All other  systems are reviewed and are negative. ? ? ?PHYSICAL EXAM:   ?VS:  BP 140/62 (BP Location: Left Arm, Patient Position: Sitting, Cuff Size: Normal)   Pulse 70   Ht '5\' 1"'$  (1.549 m)   Wt 144 lb 3.2 oz (65.4 kg)   SpO2 99%   BMI 27.25 kg/m?    ? ?General: Alert, oriented x3, no distress, healthy left subclavian pacemaker site ?Head: no evidence of trauma, PERRL, EOMI, no exophtalmos or lid lag, no myxedema, no xanthelasma; normal ears, nose and oropharynx ?Neck: normal jugular venous pulsations and no hepatojugular reflux; brisk carotid pulses without delay and no carotid bruits ?Chest: clear to auscultation, no signs of consolidation by percussion or palpation, normal fremitus, symmetrical and full respiratory excursions ?Cardiovascular: normal position and quality of the apical impulse, regular rhythm, normal first and second heart sounds, no murmurs, rubs or gallops ?Abdomen: no tenderness or distention, no masses by palpation, no abnormal pulsatility or arterial bruits, normal bowel sounds, no hepatosplenomegaly ?Extremities: no clubbing, cyanosis or edema; 2+ radial, ulnar and brachial pulses bilaterally; 2+ right femoral, posterior tibial and dorsalis pedis pulses; 2+ left femoral, posterior tibial and dorsalis pedis pulses; no subclavian or femoral bruits ?Neurological: grossly nonfocal ?Psych: Normal mood and affect ? ? ? ?Wt Readings from Last 3 Encounters:  ?12/04/21 144 lb 3.2 oz (65.4 kg)  ?12/05/20 143 lb 6.4 oz (65 kg)  ?09/28/19 146 lb (66.2 kg)  ?  ? ? ?Studies/Labs Reviewed:  ? ?EKG:  EKG is ordered today and it shows normal sinus rhythm, normal tracing ?Recent Labs: ?12/23/2018 ?total cholesterol 128, HDL 46, LDL 63, triglycerides 96, creatinine 0.9, hemoglobin 14.1, TSH 1.41 ? ?12/23/2019 ?Cholesterol 136, HDL 39, LDL 78, triglycerides 104, TSH 2.75 (04/08/2020), creatinine 0.87, normal liver function tests ? ?12/30/2020 ?total cholesterol 135, HDL 43, LDL 76, triglycerides 108 ? ?ASSESSMENT:    ? ?1. Neurocardiogenic syncope   ?2. Pacemaker   ?3. Dyslipidemia (high LDL; low HDL)   ?4. Acquired hypothyroidism   ?5. Hypercalcemia   ? ? ? ?PLAN:  ?In order of problems listed above: ? ?Neurocardiogenic syncope: 1 episode of near syncope in the last year, has not had full-blown syncope since pacemaker implantation. ?PPM: Normal device function.  Continue remote downloads every 3 months. ?HLP: LDL in target range.  HDL has improved since last year. ?Hypothyroidism: Euthyroid both clinically and based on TSH. ?Hypercalcemia/primary hyperparathyroidism: If she needs parathyroid surgery, there will be no particular precautions that need to be taken regarding her pacemaker.  She is not pacemaker dependent (in fact paces the ventricle only 0.3% of the time).  Low risk for major cardiovascular complication with the planned surgery.  It will be important to maintain a good level of hydration postoperatively to reduce the risk of presyncope/syncope. ? ? ? ?Medication Adjustments/Labs and Tests Ordered: ?Current medicines are reviewed at length with the patient today.  Concerns regarding medicines are outlined above.  Medication changes, Labs and Tests ordered today are listed in the Patient Instructions below. ?Patient Instructions  ?Medication Instructions:  ?No changes ?*If you need a refill on your cardiac medications before your next appointment, please call your pharmacy* ? ? ?Lab Work: ?None ordered ?If you have labs (blood work) drawn today and your tests are completely normal, you will receive your results only by: ?MyChart Message (if you have MyChart) OR ?A paper copy in the mail ?If you have any lab test that is abnormal or we need to change your treatment, we will call you to review the results. ? ? ?Testing/Procedures: ?None ordered ? ? ?Follow-Up: ?At [facility], you and your health needs are our priority.  As part of our continuing mission to provide you with exceptional heart care, we have created  designated Provider Care Teams.  These Care Teams include your primary Cardiologist (physician) and Advanced Practice Providers (APPs -  Physician Assistants and Nurse Practitioners) who all work together to

## 2021-12-04 NOTE — Patient Instructions (Signed)

## 2021-12-27 ENCOUNTER — Ambulatory Visit (INDEPENDENT_AMBULATORY_CARE_PROVIDER_SITE_OTHER): Payer: Managed Care, Other (non HMO)

## 2021-12-27 DIAGNOSIS — R55 Syncope and collapse: Secondary | ICD-10-CM | POA: Diagnosis not present

## 2021-12-30 LAB — CUP PACEART REMOTE DEVICE CHECK
Battery Impedance: 1835 Ohm
Battery Remaining Longevity: 49 mo
Battery Voltage: 2.76 V
Brady Statistic AP VP Percent: 0 %
Brady Statistic AP VS Percent: 3 %
Brady Statistic AS VP Percent: 0 %
Brady Statistic AS VS Percent: 97 %
Date Time Interrogation Session: 20230525171035
Implantable Lead Implant Date: 20100112
Implantable Lead Implant Date: 20100112
Implantable Lead Location: 753859
Implantable Lead Location: 753860
Implantable Lead Model: 5076
Implantable Lead Model: 5076
Implantable Pulse Generator Implant Date: 20100112
Lead Channel Impedance Value: 417 Ohm
Lead Channel Impedance Value: 584 Ohm
Lead Channel Pacing Threshold Amplitude: 0.5 V
Lead Channel Pacing Threshold Amplitude: 0.875 V
Lead Channel Pacing Threshold Pulse Width: 0.4 ms
Lead Channel Pacing Threshold Pulse Width: 0.4 ms
Lead Channel Setting Pacing Amplitude: 1.5 V
Lead Channel Setting Pacing Amplitude: 2 V
Lead Channel Setting Pacing Pulse Width: 0.4 ms
Lead Channel Setting Sensing Sensitivity: 5.6 mV

## 2022-01-09 NOTE — Progress Notes (Signed)
Remote pacemaker transmission.   

## 2022-01-15 ENCOUNTER — Other Ambulatory Visit (HOSPITAL_COMMUNITY): Payer: Self-pay | Admitting: Surgery

## 2022-01-15 ENCOUNTER — Other Ambulatory Visit: Payer: Self-pay | Admitting: Surgery

## 2022-01-15 DIAGNOSIS — E210 Primary hyperparathyroidism: Secondary | ICD-10-CM

## 2022-01-30 ENCOUNTER — Encounter (HOSPITAL_COMMUNITY)
Admission: RE | Admit: 2022-01-30 | Discharge: 2022-01-30 | Disposition: A | Discharge status: Home / Self Care | Payer: Managed Care, Other (non HMO) | Source: Ambulatory Visit | Attending: Surgery | Admitting: Surgery

## 2022-01-30 ENCOUNTER — Ambulatory Visit (HOSPITAL_COMMUNITY)
Admission: RE | Admit: 2022-01-30 | Discharge: 2022-01-30 | Disposition: A | Discharge status: Home / Self Care | Payer: Managed Care, Other (non HMO) | Source: Ambulatory Visit | Attending: Surgery | Admitting: Surgery

## 2022-01-30 DIAGNOSIS — E210 Primary hyperparathyroidism: Secondary | ICD-10-CM | POA: Insufficient documentation

## 2022-01-30 MED ORDER — TECHNETIUM TC 99M SESTAMIBI - CARDIOLITE
25.3000 | Freq: Once | INTRAVENOUS | Status: AC | PRN
  Administered 2022-01-30: 25.3 via INTRAVENOUS

## 2022-02-06 NOTE — Progress Notes (Signed)
Both the ultrasound and the sestamibi scan indicate a left inferior parathyroid adenoma.  Will plan to proceed with minimally invasive parathyroidectomy as an outpatient procedure as we discussed in the office.  Claiborne Billings - I will enter orders.  Please send to schedulers to contact patient.  tmg  Armandina Gemma, [facility] [place] [facility] Office: [phone]

## 2022-02-06 NOTE — Progress Notes (Signed)
Both the ultrasound and the sestamibi scan indicate a left inferior parathyroid adenoma.  Will plan to proceed with minimally invasive parathyroidectomy as an outpatient procedure as we discussed in the office.  Claiborne Billings - I will enter orders.  Please send to schedulers to contact patient.  tmg  Armandina Gemma, [facility] [place] [facility] Office: [phone]

## 2022-02-13 ENCOUNTER — Ambulatory Visit: Payer: Self-pay | Admitting: Surgery

## 2022-03-28 ENCOUNTER — Ambulatory Visit (INDEPENDENT_AMBULATORY_CARE_PROVIDER_SITE_OTHER): Payer: Managed Care, Other (non HMO)

## 2022-03-28 DIAGNOSIS — I469 Cardiac arrest, cause unspecified: Secondary | ICD-10-CM | POA: Diagnosis not present

## 2022-03-30 LAB — CUP PACEART REMOTE DEVICE CHECK
Battery Impedance: 1830 Ohm
Battery Remaining Longevity: 49 mo
Battery Voltage: 2.76 V
Brady Statistic AP VP Percent: 0 %
Brady Statistic AP VS Percent: 3 %
Brady Statistic AS VP Percent: 0 %
Brady Statistic AS VS Percent: 96 %
Date Time Interrogation Session: 20230824165119
Implantable Lead Implant Date: 20100112
Implantable Lead Implant Date: 20100112
Implantable Lead Location: 753859
Implantable Lead Location: 753860
Implantable Lead Model: 5076
Implantable Lead Model: 5076
Implantable Pulse Generator Implant Date: 20100112
Lead Channel Impedance Value: 411 Ohm
Lead Channel Impedance Value: 556 Ohm
Lead Channel Pacing Threshold Amplitude: 0.5 V
Lead Channel Pacing Threshold Amplitude: 1 V
Lead Channel Pacing Threshold Pulse Width: 0.4 ms
Lead Channel Pacing Threshold Pulse Width: 0.4 ms
Lead Channel Setting Pacing Amplitude: 1.5 V
Lead Channel Setting Pacing Amplitude: 2 V
Lead Channel Setting Pacing Pulse Width: 0.4 ms
Lead Channel Setting Sensing Sensitivity: 5.6 mV

## 2022-04-10 NOTE — Progress Notes (Signed)
SURGICAL WAITING ROOM VISITATION Patients having surgery or a procedure may have no more than 2 support people in the waiting area - these visitors may rotate.   Children under the age of 95 must have an adult with them who is not the patient. If the patient needs to stay at the hospital during part of their recovery, the visitor guidelines for inpatient rooms apply. Pre-op nurse will coordinate an appropriate time for 1 support person to accompany patient in pre-op.  This support person may not rotate.    Please refer to the [facility] website for the visitor guidelines for Inpatients (after your surgery is over and you are in a regular room).       Your procedure is scheduled on:       04/27/22   Report to [facility] Main Entrance    Report to admitting at  [place] AM   Call this number if you have problems the morning of surgery [phone]   Do not eat food :After Midnight.   After Midnight you may have the following liquids until __ 0615____ AM/  DAY OF SURGERY  Water Non-Citrus Juices (without pulp, NO RED) Carbonated Beverages Black Coffee (NO MILK/CREAM OR CREAMERS, sugar ok)  Clear Tea (NO MILK/CREAM OR CREAMERS, sugar ok) regular and decaf                             Plain Jell-O (NO RED)                                           Fruit ices (not with fruit pulp, NO RED)                                     Popsicles (NO RED)                                                               Sports drinks like Gatorade (NO RED)                      The day of surgery:  Drink ONE (1) Pre-Surgery Clear Ensure or G2 at   0615  AM ( have completed by )  the morning of surgery. Drink in one sitting. Do not sip.  This drink was given to you during your hospital  pre-op appointment visit. Nothing else to drink after completing the  Pre-Surgery Clear Ensure or G2.      Oral Hygiene is also important to reduce your risk of infection.                                     Remember - BRUSH YOUR TEETH THE MORNING OF SURGERY WITH YOUR REGULAR TOOTHPASTE   Do NOT smoke after Midnight   Take these medicines the morning of surgery with A SIP OF WATER:  synthroid   DO NOT TAKE ANY ORAL DIABETIC MEDICATIONS DAY OF YOUR SURGERY  Bring  CPAP mask and tubing day of surgery.                              You may not have any metal on your body including hair pins, jewelry, and body piercing             Do not wear make-up, lotions, powders, perfumes/cologne, or deodorant  Do not wear nail polish including gel and S&S, artificial/acrylic nails, or any other type of covering on natural nails including finger and toenails. If you have artificial nails, gel coating, etc. that needs to be removed by a nail salon please have this removed prior to surgery or surgery may need to be canceled/ delayed if the surgeon/ anesthesia feels like they are unable to be safely monitored.   Do not shave  48 hours prior to surgery.               Men may shave face and neck.   Do not bring valuables to the hospital. [place].   Contacts, dentures or bridgework may not be worn into surgery.   Bring small overnight bag day of surgery.   DO NOT [place]. PHARMACY WILL DISPENSE MEDICATIONS LISTED ON YOUR MEDICATION LIST TO YOU DURING YOUR ADMISSION [place]!    Patients discharged on the day of surgery will not be allowed to drive home.  Someone NEEDS to stay with you for the first 24 hours after anesthesia.   Special Instructions: Bring a copy of your healthcare power of attorney and living will documents         the day of surgery if you haven't scanned them before.              Please read over the following fact sheets you were given: IF YOU HAVE QUESTIONS ABOUT YOUR PRE-OP INSTRUCTIONS PLEASE CALL [phone]     [facility] - Preparing for Surgery Before surgery, you can play an important  role.  Because skin is not sterile, your skin needs to be as free of germs as possible.  You can reduce the number of germs on your skin by washing with CHG (chlorahexidine gluconate) soap before surgery.  CHG is an antiseptic cleaner which kills germs and bonds with the skin to continue killing germs even after washing. Please DO NOT use if you have an allergy to CHG or antibacterial soaps.  If your skin becomes reddened/irritated stop using the CHG and inform your nurse when you arrive at Short Stay. Do not shave (including legs and underarms) for at least 48 hours prior to the first CHG shower.  You may shave your face/neck. Please follow these instructions carefully:  1.  Shower with CHG Soap the night before surgery and the  morning of Surgery.  2.  If you choose to wash your hair, wash your hair first as usual with your  normal  shampoo.  3.  After you shampoo, rinse your hair and body thoroughly to remove the  shampoo.                           4.  Use CHG as you would any other liquid soap.  You can apply chg directly  to the skin and wash  Gently with a scrungie or clean washcloth.  5.  Apply the CHG Soap to your body ONLY FROM THE NECK DOWN.   Do not use on face/ open                           Wound or open sores. Avoid contact with eyes, ears mouth and genitals (private parts).                       Wash face,  Genitals (private parts) with your normal soap.             6.  Wash thoroughly, paying special attention to the area where your surgery  will be performed.  7.  Thoroughly rinse your body with warm water from the neck down.  8.  DO NOT shower/wash with your normal soap after using and rinsing off  the CHG Soap.                9.  Pat yourself dry with a clean towel.            10.  Wear clean pajamas.            11.  Place clean sheets on your bed the night of your first shower and do not  sleep with pets. Day of Surgery : Do not apply any lotions/deodorants  the morning of surgery.  Please wear clean clothes to the hospital/surgery center.  FAILURE TO FOLLOW THESE INSTRUCTIONS MAY RESULT IN THE CANCELLATION OF YOUR SURGERY PATIENT SIGNATURE_________________________________  NURSE SIGNATURE__________________________________  ________________________________________________________________________

## 2022-04-10 NOTE — Progress Notes (Signed)
Anesthesia Review:  PCP: Cardiologist : DR Croituri- LOV 12/04/21  Chest x-ray : EKG : 12/04/21  Pacemaker  Last device check 03/30/22  Echo : Stress test: Cardiac Cath :  Activity level:  Sleep Study/ CPAP : Fasting Blood Sugar :      / Checks Blood Sugar -- times a day:   Blood Thinner/ Instructions /Last Dose: ASA / Instructions/ Last Dose :

## 2022-04-11 ENCOUNTER — Other Ambulatory Visit: Payer: Self-pay

## 2022-04-11 ENCOUNTER — Encounter (HOSPITAL_COMMUNITY): Payer: Self-pay

## 2022-04-11 ENCOUNTER — Encounter: Payer: Self-pay | Admitting: Cardiovascular Disease

## 2022-04-11 ENCOUNTER — Encounter (HOSPITAL_COMMUNITY)
Admission: RE | Admit: 2022-04-11 | Discharge: 2022-04-11 | Disposition: A | Discharge status: Home / Self Care | Payer: Managed Care, Other (non HMO) | Source: Ambulatory Visit | Attending: Surgery | Admitting: Surgery

## 2022-04-11 ENCOUNTER — Other Ambulatory Visit: Payer: Self-pay | Admitting: Gynecology

## 2022-04-11 ENCOUNTER — Ambulatory Visit
Admission: RE | Admit: 2022-04-11 | Discharge: 2022-04-11 | Disposition: A | Discharge status: Home / Self Care | Payer: Managed Care, Other (non HMO) | Source: Ambulatory Visit | Attending: Gynecology | Admitting: Gynecology

## 2022-04-11 VITALS — BP 138/77 | HR 73 | Temp 98.4°F | Resp 16 | Ht 61.0 in | Wt 137.0 lb

## 2022-04-11 DIAGNOSIS — Z01818 Encounter for other preprocedural examination: Secondary | ICD-10-CM

## 2022-04-11 DIAGNOSIS — Z01812 Encounter for preprocedural laboratory examination: Secondary | ICD-10-CM | POA: Insufficient documentation

## 2022-04-11 DIAGNOSIS — Z1231 Encounter for screening mammogram for malignant neoplasm of breast: Secondary | ICD-10-CM

## 2022-04-11 LAB — CBC
HCT: 40.5 % (ref 36.0–46.0)
Hemoglobin: 14.2 g/dL (ref 12.0–15.0)
MCH: 31 pg (ref 26.0–34.0)
MCHC: 35.1 g/dL (ref 30.0–36.0)
MCV: 88.4 fL (ref 80.0–100.0)
Platelets: 207 10*3/uL (ref 150–400)
RBC: 4.58 MIL/uL (ref 3.87–5.11)
RDW: 12.8 % (ref 11.5–15.5)
WBC: 6 10*3/uL (ref 4.0–10.5)
nRBC: 0 % (ref 0.0–0.2)

## 2022-04-11 LAB — BASIC METABOLIC PANEL
Anion gap: 6 (ref 5–15)
BUN: 16 mg/dL (ref 8–23)
CO2: 24 mmol/L (ref 22–32)
Calcium: 10.6 mg/dL — ABNORMAL HIGH (ref 8.9–10.3)
Chloride: 110 mmol/L (ref 98–111)
Creatinine, Ser: 0.91 mg/dL (ref 0.44–1.00)
GFR, Estimated: >60 mL/min (ref >60)
Glucose, Bld: 104 mg/dL — ABNORMAL HIGH (ref 70–99)
Potassium: 4.3 mmol/L (ref 3.5–5.1)
Sodium: 140 mmol/L (ref 135–145)

## 2022-04-11 NOTE — Progress Notes (Signed)
PERIOPERATIVE PRESCRIPTION FOR IMPLANTED CARDIAC DEVICE PROGRAMMING  Patient Information: Name:  Sherry Davis  DOB:  Feb 26, 1951  MRN:  [id number]    Planned Procedure:  left inferior parathyroidectomy  Surgeon:  Dr Armandina Gemma  Date of Procedure:   04/27/22  Cautery will be used.  Position during surgery:   Please send documentation back to:  Elvina Sidle (Fax # [phone])  Device Information:  Clinic EP Physician:  Dani Gobble Croitoru   Device Type:  Pacemaker Manufacturer and Phone #:  Medtronic: [id number] Pacemaker Dependent?:  No. Date of Last Device Check:  03/29/22 Normal Device Function?:  No.  Electrophysiologist's Recommendations:  Have magnet available. Provide continuous ECG monitoring when magnet is used or reprogramming is to be performed.  Procedure will likely interfere with device function.  Device should be programmed:  Asynchronous pacing during procedure and returned to normal programming after procedure  Per Device Clinic Standing Orders, Simone Curia, RN  4:30 PM 04/11/2022

## 2022-04-16 NOTE — Progress Notes (Signed)
Anesthesia Chart Review   Case: [id number] Date/Time: 04/27/22 0900   Procedure: LEFT INFERIOR PARATHYROIDECTOMY (Left)   Anesthesia type: General   Pre-op diagnosis: PRIMARY HYPERPARATHYROIDISM   Location: [facility] ROOM 01 / [facility]   Surgeons: Armandina Gemma, MD       DISCUSSION:71 y.o. former smoker with h/o neurocardiogenic syncope and with cardioinhibitory component for which received a pacemaker in 2010, primary hyperparathyroidism scheduled for above procedure 04/27/2022 with Dr. Armandina Gemma.   Pt last seen by cardiology 12/04/2021. Per OV note, "Hypercalcemia/primary hyperparathyroidism: If she needs parathyroid surgery, there will be no particular precautions that need to be taken regarding her pacemaker.  She is not pacemaker dependent (in fact paces the ventricle only 0.3% of the time).  Low risk for major cardiovascular complication with the planned surgery.  It will be important to maintain a good level of hydration postoperatively to reduce the risk of presyncope/syncope."  Device orders in progress note 04/11/2022.   Anticipate pt can proceed with planned procedure barring acute status change.   VS: BP 138/77   Pulse 73   Temp 36.9 C (Oral)   Resp 16   Ht '5\' 1"'$  (1.549 m)   Wt 62.1 kg   SpO2 97%   BMI 25.89 kg/m   PROVIDERS: Deland Pretty, MD is PCP   Sanda Klein, MD is Cardiologist  LABS: Labs reviewed: Acceptable for surgery. 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   Medtroic Adapta   Sinoatrial arrest with nodal/ventricular escape [facility])    Syncope      Past Surgical History:  Procedure Laterality Date   BREAST EXCISIONAL BIOPSY Right 2000   BREAST LUMPECTOMY     COLONOSCOPY  02/23/2015   DILATION AND CURETTAGE OF UTERUS     PERMANENT PACEMAKER INSERTION  08/17/2008   Medtronic adapta    MEDICATIONS:  Cholecalciferol (VITAMIN D3 HIGH POTENCY PO)   ezetimibe (ZETIA) 10 MG tablet   levothyroxine (SYNTHROID, LEVOTHROID) 50 MCG tablet   rosuvastatin (CRESTOR) 20 MG tablet   No current facility-administered medications for this encounter.     Konrad Felix Ward, PA-C [facility] Pre-Surgical Testing [phone]

## 2022-04-16 NOTE — Anesthesia Preprocedure Evaluation (Addendum)
Anesthesia Evaluation  Patient identified by MRN, date of birth, ID band Patient awake    Reviewed: Allergy & Precautions, NPO status , Patient's Chart, lab work & pertinent test results  Airway Mallampati: IV  TM Distance: >3 FB Neck ROM: Full    Dental  (+) Teeth Intact, Dental Advisory Given   Pulmonary former smoker,  Occasionally snores at night   Pulmonary exam normal breath sounds clear to auscultation       Cardiovascular Normal cardiovascular exam+ pacemaker  Rhythm:Regular Rate:Normal   h/o neurocardiogenic syncope and with cardioinhibitory component for which received a pacemaker in 2010. not pacemaker dependent (in fact paces the ventricle only 0.3% of the time)   Neuro/Psych negative neurological ROS  negative psych ROS   GI/Hepatic Neg liver ROS, GERD  Controlled,  Endo/Other  Hypothyroidism Primary hyperparathyroidism   Renal/GU negative Renal ROS  negative genitourinary   Musculoskeletal negative musculoskeletal ROS (+)   Abdominal   Peds  Hematology negative hematology ROS (+)   Anesthesia Other Findings   Reproductive/Obstetrics negative OB ROS                           Anesthesia Physical Anesthesia Plan  ASA: 3  Anesthesia Plan: General   Post-op Pain Management: Tylenol PO (pre-op)*   Induction: Intravenous  PONV Risk Score and Plan: 3 and Ondansetron, Dexamethasone, Midazolam and Treatment may vary due to age or medical condition  Airway Management Planned: Oral ETT  Additional Equipment: None  Intra-op Plan:   Post-operative Plan: Extubation in OR  Informed Consent: I have reviewed the patients History and Physical, chart, labs and discussed the procedure including the risks, benefits and alternatives for the proposed anesthesia with the patient or authorized representative who has indicated his/her understanding and acceptance.     Dental advisory  given  Plan Discussed with: CRNA  Anesthesia Plan Comments:       Anesthesia Quick Evaluation

## 2022-04-23 ENCOUNTER — Encounter (HOSPITAL_COMMUNITY): Payer: Self-pay | Admitting: Surgery

## 2022-04-23 DIAGNOSIS — E210 Primary hyperparathyroidism: Secondary | ICD-10-CM | POA: Diagnosis present

## 2022-04-23 NOTE — H&P (Signed)
REFERRING PHYSICIAN: Birdena Crandall, MD  PROVIDER: Johnjoseph Rolfe Charlotta Newton, MD   Chief Complaint: New Consultation (Primary hyperparathyroidism)  History of Present Illness:  Patient is referred by Dr. Jacelyn Pi for surgical evaluation and management of primary hyperparathyroidism. Patient's primary care physician is Dr. Deland Pretty. Patient had been noted approximately 1 year ago to have hypercalcemia. She had additional laboratory studies including an elevated PTH level. She also had a bone density scan showing osteoporosis. At approximately the same time her mother was diagnosed with hypercalcemia and primary hyperparathyroidism. Her mother has been started on Sensipar. Recent laboratory studies include an elevated calcium level ranging from 10.4-10.8. Intact PTH level is elevated at 84. 24-hour urine collection for calcium is normal at 222. 25 hydroxy vitamin D level is normal at 34.4. Patient has not had any imaging studies performed. Patient denies any symptoms such as fatigue, bone and joint pain, and no history of nephrolithiasis. She has had no prior head or neck surgery. Patient does have a history of hypothyroidism and is on levothyroxine. 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  Review of Systems: A complete review of systems was obtained from the patient. I have reviewed this information and discussed as appropriate with the patient. See HPI as well for other ROS.  Review of Systems  Constitutional: Negative.  HENT: Negative.  Eyes: Negative.  Respiratory: Negative.  Cardiovascular: Negative.  Gastrointestinal: Negative.  Genitourinary: Negative.  Musculoskeletal: Negative.  Skin: Negative.  Neurological: Negative.  Endo/Heme/Allergies: Negative.  Psychiatric/Behavioral: Negative.    Medical History: Past Medical History:  Diagnosis Date   GERD (gastroesophageal reflux disease)   Hyperlipidemia   Thyroid disease   Patient Active  Problem List  Diagnosis   Primary hyperparathyroidism (CMS-HCC)   Past Surgical History:  Procedure Laterality Date   REPOSITIONING PREVIOUSLY IMPLANTED TRANSVENOUS PACEMAKER OR ICD ELECTRODE 2010   right breast biopsy    Allergies  Allergen Reactions   Penicillins Unknown   Current Outpatient Medications on File Prior to Visit  Medication Sig Dispense Refill   cholecalciferol (VITAMIN D3) 2,000 unit capsule Take 2,000 Units by mouth once daily   ezetimibe (ZETIA) 10 mg tablet ezetimibe 10 mg tablet TAKE 1/2 TABLET BY MOUTH EVERY DAY   levothyroxine (SYNTHROID) 50 MCG tablet levothyroxine 50 mcg tablet TAKE 1 TABLET BY MOUTH EVERY DAY ON EMPTY STOMACH IN THE MORNING   rosuvastatin (CRESTOR) 20 MG tablet rosuvastatin 20 mg tablet   No current facility-administered medications on file prior to visit.   Family History  Problem Relation Age of Onset   High blood pressure (Hypertension) Mother   Hyperlipidemia (Elevated cholesterol) Mother   Diabetes Mother   Coronary Artery Disease (Blocked arteries around heart) Father   High blood pressure (Hypertension) Father   Hyperlipidemia (Elevated cholesterol) Father   Hyperlipidemia (Elevated cholesterol) Brother    Social History   Tobacco Use  Smoking Status Former   Types: Cigarettes   Quit date: 1985   Years since quitting: 38.4  Smokeless Tobacco Not on file    Social History   Socioeconomic History   Marital status: Married  Tobacco Use   Smoking status: Former  Types: Cigarettes  Quit date: 1985  Years since quitting: 38.4  Vaping Use   Vaping Use: Never used  Substance and Sexual Activity   Alcohol use: Never   Drug use: Never   Objective:   Vitals:  BP: 130/72  Pulse: 78  Temp: 36.7 C (98  F)  SpO2: 98%  Weight: 65.3 kg (144 lb)  Height: 154.9 cm ('5\' 1"'$ )   Body mass index is 27.21 kg/m.  Physical Exam   GENERAL APPEARANCE Comfortable, no acute issues Development: normal Gross deformities:  none  SKIN Rash, lesions, ulcers: none Induration, erythema: none Nodules: none palpable  EYES Conjunctiva and lids: normal Pupils: equal and reactive  EARS, NOSE, MOUTH, THROAT External ears: no lesion or deformity External nose: no lesion or deformity Hearing: grossly normal  NECK Symmetric: yes Trachea: midline Thyroid: no palpable nodules in the thyroid bed  CHEST Respiratory effort: normal Retraction or accessory muscle use: no Breath sounds: normal bilaterally Rales, rhonchi, wheeze: none  CARDIOVASCULAR Auscultation: regular rhythm, normal rate Murmurs: none Pulses: radial pulse 2+ palpable Lower extremity edema: none  ABDOMEN Not assessed  GENITOURINARY Not assessed  MUSCULOSKELETAL Station and gait: normal Digits and nails: no clubbing or cyanosis Muscle strength: grossly normal all extremities Range of motion: grossly normal all extremities Deformity: none  LYMPHATIC Cervical: none palpable Supraclavicular: none palpable  PSYCHIATRIC Oriented to person, place, and time: yes Mood and affect: normal for situation Judgment and insight: appropriate for situation   Assessment and Plan:   Primary hyperparathyroidism (CMS-HCC)  Patient presents today on referral from her endocrinologist for surgical evaluation and recommendations regarding primary hyperparathyroidism.  Patient provided with a copy of "Parathyroid Surgery: Treatment for Your Parathyroid Gland Problem", published by [employer], 12 pages. Book reviewed and explained to patient during visit today.  Patient has biochemical evidence of primary hyperparathyroidism. She also has complications including osteoporosis. Patient has not had any imaging studies performed. I have recommended proceeding with a nuclear medicine parathyroid scan with sestamibi and an ultrasound examination of the neck. We will make arrangements for these 2 studies in the near future and I will communicate with the patient  regarding her results. If they localize a parathyroid adenoma, then I believe she will be a good candidate for minimally invasive parathyroid surgery as an outpatient. We discussed the procedure today. If the studies failed to identify the adenoma, then we will consider a 4D CT scan of the neck in hopes of localizing the adenoma and confirming her diagnosis. Patient understands and agrees to proceed.  We will order the above imaging studies and contact the patient when the results are available for review.   Armandina Gemma, MD [facility] [place] practice Office: [phone]

## 2022-04-25 NOTE — Progress Notes (Signed)
Remote pacemaker transmission.   

## 2022-04-27 ENCOUNTER — Ambulatory Visit (HOSPITAL_BASED_OUTPATIENT_CLINIC_OR_DEPARTMENT_OTHER): Payer: Managed Care, Other (non HMO) | Admitting: Certified Registered Nurse Anesthetist

## 2022-04-27 ENCOUNTER — Ambulatory Visit (HOSPITAL_COMMUNITY)
Admission: RE | Admit: 2022-04-27 | Discharge: 2022-04-27 | Disposition: A | Discharge status: Home / Self Care | Payer: Managed Care, Other (non HMO) | Source: Ambulatory Visit | Attending: Surgery | Admitting: Surgery

## 2022-04-27 ENCOUNTER — Encounter (HOSPITAL_COMMUNITY)
Admission: RE | Disposition: A | Discharge status: Home / Self Care | Payer: Self-pay | Source: Ambulatory Visit | Attending: Surgery

## 2022-04-27 ENCOUNTER — Ambulatory Visit (HOSPITAL_COMMUNITY): Payer: Managed Care, Other (non HMO) | Admitting: Physician Assistant

## 2022-04-27 ENCOUNTER — Encounter (HOSPITAL_COMMUNITY): Payer: Self-pay | Admitting: Surgery

## 2022-04-27 ENCOUNTER — Other Ambulatory Visit: Payer: Self-pay

## 2022-04-27 DIAGNOSIS — E210 Primary hyperparathyroidism: Secondary | ICD-10-CM | POA: Diagnosis present

## 2022-04-27 DIAGNOSIS — Z87891 Personal history of nicotine dependence: Secondary | ICD-10-CM

## 2022-04-27 DIAGNOSIS — D351 Benign neoplasm of parathyroid gland: Secondary | ICD-10-CM | POA: Insufficient documentation

## 2022-04-27 DIAGNOSIS — E039 Hypothyroidism, unspecified: Secondary | ICD-10-CM | POA: Diagnosis not present

## 2022-04-27 DIAGNOSIS — Z950 Presence of cardiac pacemaker: Secondary | ICD-10-CM | POA: Diagnosis not present

## 2022-04-27 DIAGNOSIS — Q892 Congenital malformations of other endocrine glands: Secondary | ICD-10-CM | POA: Diagnosis not present

## 2022-04-27 DIAGNOSIS — M810 Age-related osteoporosis without current pathological fracture: Secondary | ICD-10-CM | POA: Insufficient documentation

## 2022-04-27 DIAGNOSIS — Z01818 Encounter for other preprocedural examination: Secondary | ICD-10-CM

## 2022-04-27 DIAGNOSIS — R55 Syncope and collapse: Secondary | ICD-10-CM | POA: Insufficient documentation

## 2022-04-27 DIAGNOSIS — Z79890 Hormone replacement therapy (postmenopausal): Secondary | ICD-10-CM | POA: Insufficient documentation

## 2022-04-27 DIAGNOSIS — M8580 Other specified disorders of bone density and structure, unspecified site: Secondary | ICD-10-CM | POA: Diagnosis present

## 2022-04-27 HISTORY — PX: PARATHYROIDECTOMY: SHX19

## 2022-04-27 SURGERY — PARATHYROIDECTOMY
Anesthesia: General | Laterality: Left

## 2022-04-27 MED ORDER — FENTANYL CITRATE (PF) 100 MCG/2ML IJ SOLN
INTRAMUSCULAR | Status: AC
  Filled 2022-04-27: qty 2

## 2022-04-27 MED ORDER — CHLORHEXIDINE GLUCONATE CLOTH 2 % EX PADS: 6.0000 | MEDICATED_PAD | Freq: Once | CUTANEOUS | Status: DC

## 2022-04-27 MED ORDER — DEXAMETHASONE SODIUM PHOSPHATE 10 MG/ML IJ SOLN
INTRAMUSCULAR | Status: AC
  Filled 2022-04-27: qty 1

## 2022-04-27 MED ORDER — OXYCODONE HCL 5 MG/5ML PO SOLN: 5.0000 mg | Freq: Once | ORAL | Status: AC | PRN

## 2022-04-27 MED ORDER — HEMOSTATIC AGENTS (NO CHARGE) OPTIME
TOPICAL | Status: DC | PRN
  Administered 2022-04-27: 1

## 2022-04-27 MED ORDER — HYDROMORPHONE HCL 1 MG/ML IJ SOLN: 0.2500 mg | INTRAMUSCULAR | Status: DC | PRN

## 2022-04-27 MED ORDER — ORAL CARE MOUTH RINSE: 15.0000 mL | Freq: Once | OROMUCOSAL | Status: AC

## 2022-04-27 MED ORDER — 0.9 % SODIUM CHLORIDE (POUR BTL) OPTIME
TOPICAL | Status: DC | PRN
  Administered 2022-04-27: 1000 mL

## 2022-04-27 MED ORDER — CHLORHEXIDINE GLUCONATE 0.12 % MT SOLN
15.0000 mL | Freq: Once | OROMUCOSAL | Status: AC
  Administered 2022-04-27: 15 mL via OROMUCOSAL

## 2022-04-27 MED ORDER — PROPOFOL 10 MG/ML IV BOLUS
INTRAVENOUS | Status: AC
Start: 2022-04-27 — End: ?
  Filled 2022-04-27: qty 20

## 2022-04-27 MED ORDER — PHENYLEPHRINE 80 MCG/ML (10ML) SYRINGE FOR IV PUSH (FOR BLOOD PRESSURE SUPPORT)
PREFILLED_SYRINGE | INTRAVENOUS | Status: DC | PRN
  Administered 2022-04-27: 160 ug via INTRAVENOUS
  Administered 2022-04-27: 80 ug via INTRAVENOUS
  Administered 2022-04-27: 240 ug via INTRAVENOUS
  Administered 2022-04-27: 160 ug via INTRAVENOUS

## 2022-04-27 MED ORDER — OXYCODONE HCL 5 MG PO TABS
5.0000 mg | ORAL_TABLET | Freq: Once | ORAL | Status: AC | PRN
  Administered 2022-04-27: 5 mg via ORAL

## 2022-04-27 MED ORDER — ONDANSETRON HCL 4 MG/2ML IJ SOLN
INTRAMUSCULAR | Status: DC | PRN
  Administered 2022-04-27: 4 mg via INTRAVENOUS

## 2022-04-27 MED ORDER — SUGAMMADEX SODIUM 200 MG/2ML IV SOLN
INTRAVENOUS | Status: DC | PRN
  Administered 2022-04-27: 250 mg via INTRAVENOUS

## 2022-04-27 MED ORDER — LACTATED RINGERS IV SOLN: INTRAVENOUS | Status: DC

## 2022-04-27 MED ORDER — BUPIVACAINE HCL 0.25 % IJ SOLN
INTRAMUSCULAR | Status: DC | PRN
  Administered 2022-04-27: 9 mL

## 2022-04-27 MED ORDER — PROPOFOL 10 MG/ML IV BOLUS
INTRAVENOUS | Status: DC | PRN
  Administered 2022-04-27: 100 mg via INTRAVENOUS

## 2022-04-27 MED ORDER — CIPROFLOXACIN IN D5W 400 MG/200ML IV SOLN
400.0000 mg | INTRAVENOUS | Status: AC
  Administered 2022-04-27: 400 mg via INTRAVENOUS
  Filled 2022-04-27: qty 200

## 2022-04-27 MED ORDER — ONDANSETRON HCL 4 MG/2ML IJ SOLN: 4.0000 mg | Freq: Once | INTRAMUSCULAR | Status: DC | PRN

## 2022-04-27 MED ORDER — BUPIVACAINE HCL (PF) 0.25 % IJ SOLN
INTRAMUSCULAR | Status: AC
  Filled 2022-04-27: qty 30

## 2022-04-27 MED ORDER — MIDAZOLAM HCL 2 MG/2ML IJ SOLN
INTRAMUSCULAR | Status: AC
  Filled 2022-04-27: qty 2

## 2022-04-27 MED ORDER — DEXAMETHASONE SODIUM PHOSPHATE 10 MG/ML IJ SOLN
INTRAMUSCULAR | Status: DC | PRN
  Administered 2022-04-27: 5 mg via INTRAVENOUS

## 2022-04-27 MED ORDER — ROCURONIUM BROMIDE 10 MG/ML (PF) SYRINGE
PREFILLED_SYRINGE | INTRAVENOUS | Status: DC | PRN
  Administered 2022-04-27: 60 mg via INTRAVENOUS

## 2022-04-27 MED ORDER — LIDOCAINE 2% (20 MG/ML) 5 ML SYRINGE
INTRAMUSCULAR | Status: DC | PRN
  Administered 2022-04-27: 60 mg via INTRAVENOUS

## 2022-04-27 MED ORDER — ONDANSETRON HCL 4 MG/2ML IJ SOLN
INTRAMUSCULAR | Status: AC
Start: 2022-04-27 — End: ?
  Filled 2022-04-27: qty 2

## 2022-04-27 MED ORDER — AMISULPRIDE (ANTIEMETIC) 5 MG/2ML IV SOLN: 10.0000 mg | Freq: Once | INTRAVENOUS | Status: DC | PRN

## 2022-04-27 MED ORDER — ACETAMINOPHEN 500 MG PO TABS
1000.0000 mg | ORAL_TABLET | Freq: Once | ORAL | Status: AC
Start: 2022-04-27 — End: 2022-04-27
  Administered 2022-04-27: 1000 mg via ORAL
  Filled 2022-04-27: qty 2

## 2022-04-27 MED ORDER — MIDAZOLAM HCL 5 MG/5ML IJ SOLN
INTRAMUSCULAR | Status: DC | PRN
  Administered 2022-04-27 (×2): 1 mg via INTRAVENOUS

## 2022-04-27 MED ORDER — OXYCODONE HCL 5 MG PO TABS
ORAL_TABLET | ORAL | Status: AC
  Filled 2022-04-27: qty 1

## 2022-04-27 MED ORDER — TRAMADOL HCL 50 MG PO TABS: 50.0000 mg | ORAL_TABLET | Freq: Four times a day (QID) | ORAL | 0 refills | Status: DC | PRN

## 2022-04-27 MED ORDER — FENTANYL CITRATE (PF) 100 MCG/2ML IJ SOLN
INTRAMUSCULAR | Status: DC | PRN
  Administered 2022-04-27 (×2): 50 ug via INTRAVENOUS

## 2022-04-27 MED ORDER — LIDOCAINE HCL (PF) 2 % IJ SOLN
INTRAMUSCULAR | Status: AC
Start: 2022-04-27 — End: ?
  Filled 2022-04-27: qty 5

## 2022-04-27 MED ORDER — ROCURONIUM BROMIDE 10 MG/ML (PF) SYRINGE
PREFILLED_SYRINGE | INTRAVENOUS | Status: AC
  Filled 2022-04-27: qty 10

## 2022-04-27 SURGICAL SUPPLY — 35 items

## 2022-04-27 NOTE — Anesthesia Procedure Notes (Signed)
Procedure Name: Intubation Date/Time: 04/27/2022 9:35 AM  Performed by: West Pugh, CRNAPre-anesthesia Checklist: Patient identified, Emergency Drugs available, Suction available, Patient being monitored and Timeout performed Patient Re-evaluated:Patient Re-evaluated prior to induction Oxygen Delivery Method: Circle system utilized Preoxygenation: Pre-oxygenation with 100% oxygen Induction Type: IV induction Ventilation: Mask ventilation without difficulty Laryngoscope Size: Mac and 3 Grade View: Grade II Tube type: Oral Tube size: 7.0 mm Number of attempts: 1 Airway Equipment and Method: Stylet Placement Confirmation: ETT inserted through vocal cords under direct vision, positive ETCO2, CO2 detector and breath sounds checked- equal and bilateral Secured at: 21 cm Tube secured with: Tape Dental Injury: Teeth and Oropharynx as per pre-operative assessment

## 2022-04-27 NOTE — Interval H&P Note (Signed)
History and Physical Interval Note:  04/27/2022 9:19 AM  Sherry Davis  has presented today for surgery, with the diagnosis of PRIMARY HYPERPARATHYROIDISM.  The various methods of treatment have been discussed with the patient and family. After consideration of risks, benefits and other options for treatment, the patient has consented to    Procedure(s): LEFT INFERIOR PARATHYROIDECTOMY (Left) as a surgical intervention.    The patient's history has been reviewed, patient examined, no change in status, stable for surgery.  I have reviewed the patient's chart and labs.  Questions were answered to the patient's satisfaction.    Armandina Gemma, [place] [facility] [place] practice Office: [place]

## 2022-04-27 NOTE — Transfer of Care (Signed)
Immediate Anesthesia Transfer of Care Note  Patient: Sherry Davis  Procedure(s) Performed: LEFT INFERIOR PARATHYROIDECTOMY (Left)  Patient Location: PACU  Anesthesia Type:General  Level of Consciousness: drowsy and patient cooperative  Airway & Oxygen Therapy: Patient Spontanous Breathing and Patient connected to face mask oxygen  Post-op Assessment: Report given to RN and Post -op Vital signs reviewed and stable  Post vital signs: Reviewed and stable  Last Vitals:  Vitals Value Taken Time  BP 149/79 04/27/22 1100  Temp    Pulse 80 04/27/22 1102  Resp    SpO2 100 % 04/27/22 1102  Vitals shown include unvalidated device data.  Last Pain:  Vitals:   04/27/22 0746  TempSrc: Oral         Complications: No notable events documented.

## 2022-04-27 NOTE — Discharge Instructions (Signed)
[facility] - Dr. Joseph Johns  THYROID & PARATHYROID SURGERY:  POST-OP INSTRUCTIONS  Always review the instruction sheet provided by the hospital nurse at discharge.  A prescription for pain medication may be sent to your pharmacy at the time of discharge.  Take your pain medication as prescribed.  If narcotic pain medicine is not needed, then you may take acetaminophen (Tylenol) or ibuprofen (Advil) as needed for pain or soreness.  Take your normal home medications as prescribed unless otherwise directed.  If you need a refill on your pain medication, please contact the office during regular business hours.  Prescriptions will not be processed by the office after 5:00PM or on weekends.  Start with a light diet upon arrival home, such as soup and crackers or toast.  Be sure to drink plenty of fluids.  Resume your normal diet the day after surgery.  Most patients will experience some swelling and bruising on the chest and neck area.  Ice packs will help for the first 48 hours after arriving home.  Swelling and bruising will take several days to resolve.   It is common to experience some constipation after surgery.  Increasing fluid intake and taking a stool softener (Colace) will usually help to prevent this problem.  A mild laxative (Milk of Magnesia or Miralax) should be taken according to package directions if there has been no bowel movement after 48 hours.  Dermabond glue covers your incision. This seals the wound and you may shower at any time. The Dermabond will remain in place for about a week.  You may gradually remove the glue when it loosens around the edges.  If you need to loosen the Dermabond for removal, apply a layer of Vaseline to the wound for 15 minutes and then remove with a Kleenex. Your sutures are under the skin and will not show - they will dissolve on their own.  You may resume light daily activities beginning the day after discharge (such as self-care,  walking, climbing stairs), gradually increasing activities as tolerated. You may have sexual intercourse when it is comfortable. Refrain from any heavy lifting or straining until approved by your doctor. You may drive when you no longer are taking prescription pain medication, you can comfortably wear a seatbelt, and you can safely maneuver your car and apply the brakes.  You will see your doctor in the office for a follow-up appointment approximately three weeks after your surgery.  Make sure that you call for this appointment within a day or two after you arrive home to insure a convenient appointment time. Please have any requested laboratory tests performed a few days prior to your office visit so that the results will be available at your follow up appointment.  WHEN TO CALL THE [facility] OFFICE: -- Fever greater than 101.5 -- Inability to urinate -- Nausea and/or vomiting - persistent -- Extreme swelling or bruising -- Continued bleeding from incision -- Increased pain, redness, or drainage from the incision -- Difficulty swallowing or breathing -- Muscle cramping or spasms -- Numbness or tingling in hands or around lips  The clinic staff is available to answer your questions during regular business hours.  Please don't hesitate to call and ask to speak to one of the nurses if you have concerns.  [facility] OFFICE: [phone] (24 hours)  Please sign up for MyChart accounts. This will allow you to communicate directly with my nurse or myself without having to call the office. It will also allow you   to view your test results. You will need to enroll in MyChart for my office ([facility]) and for the hospital ().  Annagrace Carr, MD [facility] practice 

## 2022-04-27 NOTE — Op Note (Signed)
OPERATIVE REPORT - PARATHYROIDECTOMY  Preoperative diagnosis: Primary hyperparathyroidism  Postop diagnosis: Same  Procedure: Left inferior minimally invasive parathyroidectomy  Surgeon:  Armandina Gemma, MD  Anesthesia: General endotracheal  Estimated blood loss: Minimal  Preparation: ChloraPrep  Indications: Patient is referred by Dr. Jacelyn Pi for surgical evaluation and management of primary hyperparathyroidism. Patient's primary care physician is Dr. Deland Pretty. Patient had been noted approximately 1 year ago to have hypercalcemia. She had additional laboratory studies including an elevated PTH level. She also had a bone density scan showing osteoporosis. At approximately the same time her mother was diagnosed with hypercalcemia and primary hyperparathyroidism. Her mother has been started on Sensipar. Recent laboratory studies include an elevated calcium level ranging from 10.4-10.8. Intact PTH level is elevated at 84. 24-hour urine collection for calcium is normal at 222. 25 hydroxy vitamin D level is normal at 34.4.  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  Ultrasound confirms the presence of a nodular mass in this location.  Patient now comes to surgery for minimally invasive parathyroidectomy.  Procedure: The patient was prepared in the pre-operative holding area. The patient was brought to the operating room and placed in a supine position on the operating room table. Following administration of general anesthesia, the patient was positioned and then prepped and draped in the usual strict aseptic fashion. After ascertaining that an adequate level of anesthesia been achieved, a neck incision was made with a #15 blade. Dissection was carried through subcutaneous tissues and platysma. Hemostasis was obtained with the electrocautery. Skin flaps were developed circumferentially and a Weitlander retractor was placed for  exposure.  Strap muscles were incised in the midline. Strap muscles were reflected laterally exposing the thyroid lobe. With gentle blunt dissection the thyroid lobe was mobilized.  Dissection was carried posteriorly and an enlarged parathyroid gland was identified along the inferior and posterior aspect of the left thyroid lobe.. It was gently mobilized. Vascular structures were divided between small ligaclips. Care was taken to avoid the recurrent laryngeal nerve. The parathyroid gland was completely excised. It was submitted to pathology where frozen section confirmed hypercellular parathyroid tissue consistent with adenoma.  A second nodular density was noted just inferiorly and along the lateral aspect of the trachea.  This measured approximately 1 cm in size and was quite round.  It was excised in its entirety and submitted for frozen section which confirmed thyroid tissue.  Neck was irrigated with warm saline and good hemostasis was noted. Fibrillar was placed in the operative field. Strap muscles were approximated in the midline with interrupted 3-0 Vicryl sutures. Platysma was closed with interrupted 3-0 Vicryl sutures. Marcaine was infiltrated circumferentially. Skin was closed with a running 4-0 Monocryl subcuticular suture. Wound was washed and dried and Dermabond was applied. Patient was awakened from anesthesia and brought to the recovery room. The patient tolerated the procedure well.   Armandina Gemma, [facility] Office: [phone]

## 2022-04-27 NOTE — Addendum Note (Signed)
Addendum  created 04/27/22 1215 by West Pugh, CRNA   Flowsheet accepted

## 2022-04-27 NOTE — Anesthesia Postprocedure Evaluation (Signed)
Anesthesia Post Note  Patient: [name]  Procedure(s) Performed: LEFT INFERIOR PARATHYROIDECTOMY (Left)     Patient location during evaluation: PACU Anesthesia Type: General Level of consciousness: awake and alert, oriented and patient cooperative Pain management: pain level controlled Vital Signs Assessment: post-procedure vital signs reviewed and stable Respiratory status: spontaneous breathing, nonlabored ventilation and respiratory function stable Cardiovascular status: blood pressure returned to baseline and stable Postop Assessment: no apparent nausea or vomiting Anesthetic complications: no   No notable events documented.  Last Vitals:  Vitals:   04/27/22 1100 04/27/22 1115  BP: (!) 149/79 130/77  Pulse: 80 80  Resp: 12 15  Temp: 36.6 C   SpO2: 100% 96%    Last Pain:  Vitals:   04/27/22 1115  TempSrc:   PainSc: 0-No pain                 Pervis Hocking

## 2022-04-28 ENCOUNTER — Encounter (HOSPITAL_COMMUNITY): Payer: Self-pay | Admitting: Surgery

## 2022-04-30 LAB — SURGICAL PATHOLOGY

## 2022-05-09 NOTE — Progress Notes (Signed)
Final path is good.  Shows a benign parathyroid adenoma and a benign thyroid nodule which was separate from the thyroid gland.  [place], MD [facility] [place] practice Office: [phone]

## 2022-05-09 NOTE — Progress Notes (Signed)
      In response to:  "                                                                                   05/04/22   RE:  [name], Sherry Davis DOB:  March 13, 1951     Dear Dr. Harlow Asa,   According to the operative report dated 04/27/22, this patient underwent excision of a left neck nodular density in addition to the parathyroidectomy.  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  Could you please clarify the final diagnosis regarding the left neck nodule/thyroid?   - thyroid nodule - lymphocytic inflammation of the thyroid (autoimmunie thyroiditis) - other specified disorders of thyroid - other (please specify) - unable to clinically determine     Supporting Information From pathology report FINAL MICROSCOPIC DIAGNOSIS:  B. 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 A specific answer is not anticipated or expected.   Thank You,   Prince Solian, [place] [phone]"  For diagnosis I would use "ectopic thyroid nodule" for the final diagnosis.  Armandina Gemma, MD [facility] [place] practice Office: [phone]

## 2022-06-27 ENCOUNTER — Ambulatory Visit (INDEPENDENT_AMBULATORY_CARE_PROVIDER_SITE_OTHER): Payer: Managed Care, Other (non HMO)

## 2022-06-27 DIAGNOSIS — I495 Sick sinus syndrome: Secondary | ICD-10-CM | POA: Diagnosis not present

## 2022-06-29 LAB — CUP PACEART REMOTE DEVICE CHECK
Battery Impedance: 1985 Ohm
Battery Remaining Longevity: 43 mo
Battery Voltage: 2.76 V
Brady Statistic AP VP Percent: 0 %
Brady Statistic AP VS Percent: 18 %
Brady Statistic AS VP Percent: 0 %
Brady Statistic AS VS Percent: 81 %
Date Time Interrogation Session: 20231122161252
Implantable Lead Connection Status: 753985
Implantable Lead Connection Status: 753985
Implantable Lead Implant Date: 20100112
Implantable Lead Implant Date: 20100112
Implantable Lead Location: 753859
Implantable Lead Location: 753860
Implantable Lead Model: 5076
Implantable Lead Model: 5076
Implantable Pulse Generator Implant Date: 20100112
Lead Channel Impedance Value: 398 Ohm
Lead Channel Impedance Value: 525 Ohm
Lead Channel Pacing Threshold Amplitude: 0.5 V
Lead Channel Pacing Threshold Amplitude: 0.875 V
Lead Channel Pacing Threshold Pulse Width: 0.4 ms
Lead Channel Pacing Threshold Pulse Width: 0.4 ms
Lead Channel Setting Pacing Amplitude: 1.5 V
Lead Channel Setting Pacing Amplitude: 2 V
Lead Channel Setting Pacing Pulse Width: 0.4 ms
Lead Channel Setting Sensing Sensitivity: 5.6 mV
Zone Setting Status: 755011
Zone Setting Status: 755011

## 2022-07-19 NOTE — Progress Notes (Signed)
Remote pacemaker transmission.   

## 2022-08-23 ENCOUNTER — Other Ambulatory Visit: Payer: Self-pay | Admitting: Endocrinology

## 2022-08-23 DIAGNOSIS — M810 Age-related osteoporosis without current pathological fracture: Secondary | ICD-10-CM

## 2022-09-26 ENCOUNTER — Ambulatory Visit: Payer: Managed Care, Other (non HMO)

## 2022-09-26 DIAGNOSIS — I495 Sick sinus syndrome: Secondary | ICD-10-CM | POA: Diagnosis not present

## 2022-09-27 LAB — CUP PACEART REMOTE DEVICE CHECK
Battery Impedance: 2048 Ohm
Battery Remaining Longevity: 42 mo
Battery Voltage: 2.75 V
Brady Statistic AP VP Percent: 0 %
Brady Statistic AP VS Percent: 20 %
Brady Statistic AS VP Percent: 0 %
Brady Statistic AS VS Percent: 79 %
Date Time Interrogation Session: 20240221182731
Implantable Lead Connection Status: 753985
Implantable Lead Connection Status: 753985
Implantable Lead Implant Date: 20100112
Implantable Lead Implant Date: 20100112
Implantable Lead Location: 753859
Implantable Lead Location: 753860
Implantable Lead Model: 5076
Implantable Lead Model: 5076
Implantable Pulse Generator Implant Date: 20100112
Lead Channel Impedance Value: 411 Ohm
Lead Channel Impedance Value: 562 Ohm
Lead Channel Pacing Threshold Amplitude: 0.625 V
Lead Channel Pacing Threshold Amplitude: 0.875 V
Lead Channel Pacing Threshold Pulse Width: 0.4 ms
Lead Channel Pacing Threshold Pulse Width: 0.4 ms
Lead Channel Setting Pacing Amplitude: 1.5 V
Lead Channel Setting Pacing Amplitude: 2 V
Lead Channel Setting Pacing Pulse Width: 0.4 ms
Lead Channel Setting Sensing Sensitivity: 5.6 mV
Zone Setting Status: 755011
Zone Setting Status: 755011

## 2022-10-24 NOTE — Progress Notes (Signed)
Remote pacemaker transmission.   

## 2022-12-24 ENCOUNTER — Encounter: Payer: Self-pay | Admitting: Cardiovascular Disease

## 2022-12-24 ENCOUNTER — Ambulatory Visit: Payer: Managed Care, Other (non HMO) | Attending: Cardiovascular Disease | Admitting: Cardiovascular Disease

## 2022-12-24 VITALS — BP 142/69 | HR 70 | Ht 61.5 in | Wt 144.8 lb

## 2022-12-24 DIAGNOSIS — E785 Hyperlipidemia, unspecified: Secondary | ICD-10-CM

## 2022-12-24 DIAGNOSIS — Z950 Presence of cardiac pacemaker: Secondary | ICD-10-CM | POA: Diagnosis not present

## 2022-12-24 DIAGNOSIS — R55 Syncope and collapse: Secondary | ICD-10-CM | POA: Diagnosis not present

## 2022-12-24 DIAGNOSIS — R030 Elevated blood-pressure reading, without diagnosis of hypertension: Secondary | ICD-10-CM

## 2022-12-24 DIAGNOSIS — E210 Primary hyperparathyroidism: Secondary | ICD-10-CM

## 2022-12-24 DIAGNOSIS — E039 Hypothyroidism, unspecified: Secondary | ICD-10-CM | POA: Diagnosis not present

## 2022-12-24 NOTE — Patient Instructions (Signed)
Medication Instructions:  No changes *If you need a refill on your cardiac medications before your next appointment, please call your pharmacy*  Follow-Up: At [facility], you and your health needs are our priority.  As part of our continuing mission to provide you with exceptional heart care, we have created designated Provider Care Teams.  These Care Teams include your primary Cardiologist (physician) and Advanced Practice Providers (APPs -  Physician Assistants and Nurse Practitioners) who all work together to provide you with the care you need, when you need it.  We recommend signing up for the patient portal called "MyChart".  Sign up information is provided on this After Visit Summary.  MyChart is used to connect with patients for Virtual Visits (Telemedicine).  Patients are able to view lab/test results, encounter notes, upcoming appointments, etc.  Non-urgent messages can be sent to your provider as well.   To learn more about what you can do with MyChart, go to [email or web address].    Your next appointment:   1 year(s)  Provider:   Mihai Croitoru, MD     

## 2022-12-24 NOTE — Progress Notes (Signed)
Cardiology Office Note    Date:  12/24/2022   ID:  [name], [name] Nov 22, 1950, MRN [id number]  PCP:  Merri Brunette, MD  Cardiologist:   Thurmon Fair, MD   Chief complaint: Follow-up neurocardiogenic syncope, pacemaker check, hyperlipidemia   History of Present Illness:  Sherry Davis is a 72 y.o. female with history of neurocardiogenic syncope and with cardioinhibitory component for which received a pacemaker in 2010 also followed for hyperlipidemia.  Show uncomplicated surgery for hyperparathyroidism last year.  She has not had any episodes of syncope or even near syncope during the last year.  She has not had any syncopal events since the pacemaker was implanted.  The patient specifically denies any chest pain at rest exertion, dyspnea at rest or with exertion, orthopnea, paroxysmal nocturnal dyspnea, syncope, palpitations, focal neurological deficits, intermittent claudication, lower extremity edema, unexplained weight gain, cough, hemoptysis or wheezing.  Blood pressure is mildly elevated today, but usually in normal range.    Device interrogation shows normal pacemaker function.  She only requires roughly 20% atrial pacing and never requires ventricular pacing  No episodes of high ventricular or atrial rates.  All lead and generator parameters are normal.  Estimated generator longevity still over 3 years.  Due for repeat lipid profile with PCP next week.   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   Medtroic Adapta   Sinoatrial arrest with nodal/ventricular escape [facility])    Syncope     Past Surgical History:  Procedure Laterality Date   BREAST EXCISIONAL BIOPSY Right 2000   BREAST LUMPECTOMY     COLONOSCOPY  02/23/2015   DILATION AND CURETTAGE OF UTERUS     PARATHYROIDECTOMY Left 04/27/2022   Procedure: LEFT INFERIOR PARATHYROIDECTOMY;  Surgeon:  Darnell Level, MD;  Location: [facility];  Service: General;  Laterality: Left;   PERMANENT PACEMAKER INSERTION  08/17/2008   Medtronic adapta    Current Medications: Outpatient Medications Prior to Visit  Medication Sig Dispense Refill   Cholecalciferol (VITAMIN D3 HIGH POTENCY PO) Take 1,000 Units by mouth 3 (three) times a week.     cyanocobalamin 100 MCG tablet Take 100 mcg by mouth daily.     ezetimibe (ZETIA) 10 MG tablet Take 5 mg by mouth daily.     levothyroxine (SYNTHROID, LEVOTHROID) 50 MCG tablet Take 50 mcg by mouth daily before breakfast.     rosuvastatin (CRESTOR) 20 MG tablet Take 20 mg by mouth daily.     traMADol (ULTRAM) 50 MG tablet Take 1-2 tablets (50-100 mg total) by mouth every 6 (six) hours as needed for moderate pain. (Patient not taking: Reported on 12/24/2022) 15 tablet 0   No facility-administered medications prior to visit.     Allergies:   Penicillins   Social History   Socioeconomic History   Marital status: Married    Spouse name: Not on file   Number of children: Not on file   Years of education: Not on file   Highest education level: Not on file  Occupational History   Not on file  Tobacco Use   Smoking status: Former    Types: Cigarettes   Smokeless tobacco: Never  Vaping Use   Vaping Use: Never used  Substance and Sexual Activity   Alcohol use: No    Alcohol/week: 0.0 standard drinks of alcohol   Drug use: No   Sexual activity: Not on file  Other Topics Concern   Not on file  Social History Narrative   Not on file   Social Determinants of Health   Financial Resource Strain: Not on file  Food Insecurity: Not on file  Transportation Needs: Not on file  Physical Activity: Not on file  Stress: Not on file  Social Connections: Not on file     Family History:  The patient's family history includes Hyperlipidemia in her father and mother; Hypertension in her brother and mother.   ROS:   Please see the history of present illness.     ROS All other systems are reviewed and are negative.   PHYSICAL EXAM:   VS:  BP (!) 142/69   Pulse 70   Ht 5' 1.5" (1.562 m)   Wt 144 lb 12.8 oz (65.7 kg)   SpO2 95%   BMI 26.92 kg/m      General: Alert, oriented x3, no distress, mildly overweight Head: no evidence of trauma, PERRL, EOMI, no exophtalmos or lid lag, no myxedema, no xanthelasma; normal ears, nose and oropharynx Neck: normal jugular venous pulsations and no hepatojugular reflux; brisk carotid pulses without delay and no carotid bruits Chest: clear to auscultation, no signs of consolidation by percussion or palpation, normal fremitus, symmetrical and full respiratory excursions Cardiovascular: normal position and quality of the apical impulse, regular rhythm, normal first and second heart sounds, no murmurs, rubs or gallops Abdomen: no tenderness or distention, no masses by palpation, no abnormal pulsatility or arterial bruits, normal bowel sounds, no hepatosplenomegaly Extremities: no clubbing, cyanosis or edema; 2+ radial, ulnar and brachial pulses bilaterally; 2+ right femoral, posterior tibial and dorsalis pedis pulses; 2+ left femoral, posterior tibial and dorsalis pedis pulses; no subclavian or femoral bruits Neurological: grossly nonfocal Psych: Normal mood and affect     Wt Readings from Last 3 Encounters:  12/24/22 144 lb 12.8 oz (65.7 kg)  04/27/22 137 lb (62.1 kg)  04/11/22 137 lb (62.1 kg)      Studies/Labs Reviewed:   EKG:  EKG is ordered today and it shows normal sinus rhythm, normal tracing Recent Labs: 12/23/2018 total cholesterol 128, HDL 46, LDL 63, triglycerides 96, creatinine 0.9, hemoglobin 14.1, TSH 1.41  12/23/2019 Cholesterol 136, HDL 39, LDL 78, triglycerides 104, TSH 2.75 (04/08/2020), creatinine 0.87, normal liver function tests  12/30/2020 total cholesterol 135, HDL 43, LDL 76, triglycerides 108   HYPERTENSION CONTROL Vitals:   12/24/22 0813 12/24/22 0830  BP: (!) 144/70 (!)  142/69    The patient's blood pressure is elevated above target today.  In order to address the patient's elevated BP: Blood pressure will be monitored at home to determine if medication changes need to be made.       ASSESSMENT:    1. Neurocardiogenic syncope   2. Pacemaker   3. Dyslipidemia (high LDL; low HDL)   4. Acquired hypothyroidism   5. Hyperparathyroidism, primary (HCC)   6. Elevated blood pressure reading      PLAN:  In order of problems listed above:  Neurocardiogenic syncope: no episodes of near syncope in the last year, has not had full-blown syncope since pacemaker implantation. PPM: Normal device function.  Continue remote downloads every 3 months. HLP: LDL in target range at most recent check. Labs next week w Dr. Renne Crigler. Hypothyroidism: euthyroid on supplement Hypercalcemia/primary hyperparathyroidism: borderline elevated calcium level. Mildly elevatd BP. Watch sodium intake, avoid extra weight gain, regular exercise.    Medication Adjustments/Labs and Tests Ordered: Current medicines are reviewed at length  with the patient today.  Concerns regarding medicines are outlined above.  Medication changes, Labs and Tests ordered today are listed in the Patient Instructions below. Patient Instructions  Medication Instructions:  No changes *If you need a refill on your cardiac medications before your next appointment, please call your pharmacy*   Follow-Up: At [facility], you and your health needs are our priority.  As part of our continuing mission to provide you with exceptional heart care, we have created designated Provider Care Teams.  These Care Teams include your primary Cardiologist (physician) and Advanced Practice Providers (APPs -  Physician Assistants and Nurse Practitioners) who all work together to provide you with the care you need, when you need it.  We recommend signing up for the patient portal called "MyChart".  Sign up information is  provided on this After Visit Summary.  MyChart is used to connect with patients for Virtual Visits (Telemedicine).  Patients are able to view lab/test results, encounter notes, upcoming appointments, etc.  Non-urgent messages can be sent to your provider as well.   To learn more about what you can do with MyChart, go to [email or web address].    Your next appointment:   1 year(s)  Provider:   Thurmon Fair, MD       Signed, Thurmon Fair, MD  12/24/2022 8:34 AM    [facility] [address] [place], [place], [place]  [address] Phone: [phone]; Fax: [phone]

## 2023-01-03 ENCOUNTER — Ambulatory Visit (INDEPENDENT_AMBULATORY_CARE_PROVIDER_SITE_OTHER): Payer: Managed Care, Other (non HMO)

## 2023-01-03 DIAGNOSIS — I495 Sick sinus syndrome: Secondary | ICD-10-CM

## 2023-01-04 LAB — CUP PACEART REMOTE DEVICE CHECK
Battery Impedance: 2091 Ohm
Battery Remaining Longevity: 41 mo
Battery Voltage: 2.74 V
Brady Statistic AP VP Percent: 0 %
Brady Statistic AP VS Percent: 21 %
Brady Statistic AS VP Percent: 0 %
Brady Statistic AS VS Percent: 78 %
Date Time Interrogation Session: 20240530163647
Implantable Lead Connection Status: 753985
Implantable Lead Connection Status: 753985
Implantable Lead Implant Date: 20100112
Implantable Lead Implant Date: 20100112
Implantable Lead Location: 753859
Implantable Lead Location: 753860
Implantable Lead Model: 5076
Implantable Lead Model: 5076
Implantable Pulse Generator Implant Date: 20100112
Lead Channel Impedance Value: 413 Ohm
Lead Channel Impedance Value: 529 Ohm
Lead Channel Pacing Threshold Amplitude: 0.625 V
Lead Channel Pacing Threshold Amplitude: 0.875 V
Lead Channel Pacing Threshold Pulse Width: 0.4 ms
Lead Channel Pacing Threshold Pulse Width: 0.4 ms
Lead Channel Setting Pacing Amplitude: 1.5 V
Lead Channel Setting Pacing Amplitude: 2 V
Lead Channel Setting Pacing Pulse Width: 0.4 ms
Lead Channel Setting Sensing Sensitivity: 5.6 mV
Zone Setting Status: 755011
Zone Setting Status: 755011

## 2023-01-10 LAB — LAB REPORT - SCANNED
EGFR: 65
EGFR: 79

## 2023-01-16 ENCOUNTER — Encounter: Payer: Self-pay | Admitting: Endocrinology

## 2023-01-22 NOTE — Progress Notes (Signed)
Remote pacemaker transmission.   

## 2023-03-08 ENCOUNTER — Ambulatory Visit
Admission: RE | Admit: 2023-03-08 | Discharge: 2023-03-08 | Disposition: A | Discharge status: Home / Self Care | Payer: Managed Care, Other (non HMO) | Source: Ambulatory Visit | Attending: Endocrinology | Admitting: Endocrinology

## 2023-03-08 DIAGNOSIS — M810 Age-related osteoporosis without current pathological fracture: Secondary | ICD-10-CM

## 2023-03-15 ENCOUNTER — Ambulatory Visit: Payer: Managed Care, Other (non HMO) | Admitting: Cardiovascular Disease

## 2023-04-04 ENCOUNTER — Ambulatory Visit: Payer: Managed Care, Other (non HMO)

## 2023-04-04 DIAGNOSIS — I495 Sick sinus syndrome: Secondary | ICD-10-CM

## 2023-04-05 LAB — CUP PACEART REMOTE DEVICE CHECK
Battery Impedance: 2115 Ohm
Battery Remaining Longevity: 41 mo
Battery Voltage: 2.74 V
Brady Statistic AP VP Percent: 0 %
Brady Statistic AP VS Percent: 18 %
Brady Statistic AS VP Percent: 0 %
Brady Statistic AS VS Percent: 82 %
Date Time Interrogation Session: 20240829163944
Implantable Lead Connection Status: 753985
Implantable Lead Connection Status: 753985
Implantable Lead Implant Date: 20100112
Implantable Lead Implant Date: 20100112
Implantable Lead Location: 753859
Implantable Lead Location: 753860
Implantable Lead Model: 5076
Implantable Lead Model: 5076
Implantable Pulse Generator Implant Date: 20100112
Lead Channel Impedance Value: 398 Ohm
Lead Channel Impedance Value: 503 Ohm
Lead Channel Pacing Threshold Amplitude: 0.5 V
Lead Channel Pacing Threshold Amplitude: 0.875 V
Lead Channel Pacing Threshold Pulse Width: 0.4 ms
Lead Channel Pacing Threshold Pulse Width: 0.4 ms
Lead Channel Setting Pacing Amplitude: 1.5 V
Lead Channel Setting Pacing Amplitude: 2 V
Lead Channel Setting Pacing Pulse Width: 0.4 ms
Lead Channel Setting Sensing Sensitivity: 5.6 mV
Zone Setting Status: 755011
Zone Setting Status: 755011

## 2023-04-11 NOTE — Progress Notes (Signed)
Remote pacemaker transmission.   

## 2023-04-22 ENCOUNTER — Other Ambulatory Visit: Payer: Self-pay | Admitting: Gynecology

## 2023-04-22 ENCOUNTER — Ambulatory Visit
Admission: RE | Admit: 2023-04-22 | Discharge: 2023-04-22 | Disposition: A | Discharge status: Home / Self Care | Payer: Managed Care, Other (non HMO) | Source: Ambulatory Visit | Attending: Gynecology | Admitting: Gynecology

## 2023-04-22 DIAGNOSIS — Z0000 Encounter for general adult medical examination without abnormal findings: Secondary | ICD-10-CM

## 2023-05-13 DIAGNOSIS — H43393 Other vitreous opacities, bilateral: Secondary | ICD-10-CM | POA: Diagnosis not present

## 2023-05-13 DIAGNOSIS — H2511 Age-related nuclear cataract, right eye: Secondary | ICD-10-CM | POA: Diagnosis not present

## 2023-05-13 DIAGNOSIS — H25012 Cortical age-related cataract, left eye: Secondary | ICD-10-CM | POA: Diagnosis not present

## 2023-05-13 DIAGNOSIS — H40053 Ocular hypertension, bilateral: Secondary | ICD-10-CM | POA: Diagnosis not present

## 2023-05-13 DIAGNOSIS — H5213 Myopia, bilateral: Secondary | ICD-10-CM | POA: Diagnosis not present

## 2023-05-13 DIAGNOSIS — H52223 Regular astigmatism, bilateral: Secondary | ICD-10-CM | POA: Diagnosis not present

## 2023-05-13 DIAGNOSIS — H524 Presbyopia: Secondary | ICD-10-CM | POA: Diagnosis not present

## 2023-05-20 DIAGNOSIS — L708 Other acne: Secondary | ICD-10-CM | POA: Diagnosis not present

## 2023-05-20 DIAGNOSIS — L728 Other follicular cysts of the skin and subcutaneous tissue: Secondary | ICD-10-CM | POA: Diagnosis not present

## 2023-07-05 ENCOUNTER — Ambulatory Visit (INDEPENDENT_AMBULATORY_CARE_PROVIDER_SITE_OTHER): Payer: Medicare Other

## 2023-07-05 DIAGNOSIS — I495 Sick sinus syndrome: Secondary | ICD-10-CM

## 2023-07-06 LAB — CUP PACEART REMOTE DEVICE CHECK
Battery Impedance: 2631 Ohm
Battery Remaining Longevity: 33 mo
Battery Voltage: 2.74 V
Brady Statistic AP VP Percent: 0 %
Brady Statistic AP VS Percent: 20 %
Brady Statistic AS VP Percent: 0 %
Brady Statistic AS VS Percent: 80 %
Date Time Interrogation Session: 20241129175941
Implantable Lead Connection Status: 753985
Implantable Lead Connection Status: 753985
Implantable Lead Implant Date: 20100112
Implantable Lead Implant Date: 20100112
Implantable Lead Location: 753859
Implantable Lead Location: 753860
Implantable Lead Model: 5076
Implantable Lead Model: 5076
Implantable Pulse Generator Implant Date: 20100112
Lead Channel Impedance Value: 393 Ohm
Lead Channel Impedance Value: 505 Ohm
Lead Channel Pacing Threshold Amplitude: 0.625 V
Lead Channel Pacing Threshold Amplitude: 1 V
Lead Channel Pacing Threshold Pulse Width: 0.4 ms
Lead Channel Pacing Threshold Pulse Width: 0.4 ms
Lead Channel Setting Pacing Amplitude: 1.5 V
Lead Channel Setting Pacing Amplitude: 2 V
Lead Channel Setting Pacing Pulse Width: 0.4 ms
Lead Channel Setting Sensing Sensitivity: 5.6 mV
Zone Setting Status: 755011
Zone Setting Status: 755011

## 2023-07-29 NOTE — Addendum Note (Signed)
Addended by: Elease Etienne A on: 07/29/2023 12:32 PM   Modules accepted: Orders

## 2023-07-29 NOTE — Progress Notes (Signed)
Remote pacemaker transmission.   

## 2023-08-20 DIAGNOSIS — E210 Primary hyperparathyroidism: Secondary | ICD-10-CM | POA: Diagnosis not present

## 2023-08-20 DIAGNOSIS — E039 Hypothyroidism, unspecified: Secondary | ICD-10-CM | POA: Diagnosis not present

## 2023-08-20 DIAGNOSIS — M810 Age-related osteoporosis without current pathological fracture: Secondary | ICD-10-CM | POA: Diagnosis not present

## 2023-08-26 DIAGNOSIS — E032 Hypothyroidism due to medicaments and other exogenous substances: Secondary | ICD-10-CM | POA: Diagnosis not present

## 2023-08-26 DIAGNOSIS — M810 Age-related osteoporosis without current pathological fracture: Secondary | ICD-10-CM | POA: Diagnosis not present

## 2023-08-26 DIAGNOSIS — E559 Vitamin D deficiency, unspecified: Secondary | ICD-10-CM | POA: Diagnosis not present

## 2023-08-26 DIAGNOSIS — E210 Primary hyperparathyroidism: Secondary | ICD-10-CM | POA: Diagnosis not present

## 2023-08-26 DIAGNOSIS — E039 Hypothyroidism, unspecified: Secondary | ICD-10-CM | POA: Diagnosis not present

## 2023-10-03 ENCOUNTER — Ambulatory Visit (INDEPENDENT_AMBULATORY_CARE_PROVIDER_SITE_OTHER): Payer: Managed Care, Other (non HMO)

## 2023-10-03 DIAGNOSIS — I495 Sick sinus syndrome: Secondary | ICD-10-CM | POA: Diagnosis not present

## 2023-10-05 LAB — CUP PACEART REMOTE DEVICE CHECK
Battery Impedance: 2926 Ohm
Battery Remaining Longevity: 28 mo
Battery Voltage: 2.71 V
Brady Statistic AP VP Percent: 0 %
Brady Statistic AP VS Percent: 21 %
Brady Statistic AS VP Percent: 0 %
Brady Statistic AS VS Percent: 79 %
Date Time Interrogation Session: 20250227164555
Implantable Lead Connection Status: 753985
Implantable Lead Connection Status: 753985
Implantable Lead Implant Date: 20100112
Implantable Lead Implant Date: 20100112
Implantable Lead Location: 753859
Implantable Lead Location: 753860
Implantable Lead Model: 5076
Implantable Lead Model: 5076
Implantable Pulse Generator Implant Date: 20100112
Lead Channel Impedance Value: 398 Ohm
Lead Channel Impedance Value: 578 Ohm
Lead Channel Pacing Threshold Amplitude: 0.625 V
Lead Channel Pacing Threshold Amplitude: 1.125 V
Lead Channel Pacing Threshold Pulse Width: 0.4 ms
Lead Channel Pacing Threshold Pulse Width: 0.4 ms
Lead Channel Setting Pacing Amplitude: 1.5 V
Lead Channel Setting Pacing Amplitude: 2.25 V
Lead Channel Setting Pacing Pulse Width: 0.4 ms
Lead Channel Setting Sensing Sensitivity: 5.6 mV
Zone Setting Status: 755011
Zone Setting Status: 755011

## 2023-10-13 ENCOUNTER — Encounter: Payer: Self-pay | Admitting: Cardiovascular Disease

## 2023-11-05 NOTE — Progress Notes (Signed)
 Remote pacemaker transmission.

## 2023-11-05 NOTE — Addendum Note (Signed)
 Addended by: Elease Etienne A on: 11/05/2023 09:21 AM   Modules accepted: Orders

## 2023-12-26 ENCOUNTER — Encounter: Payer: Self-pay | Admitting: Cardiovascular Disease

## 2023-12-26 ENCOUNTER — Ambulatory Visit: Attending: Cardiovascular Disease | Admitting: Cardiovascular Disease

## 2023-12-26 VITALS — BP 138/70 | HR 79 | Ht 61.0 in | Wt 142.2 lb

## 2023-12-26 DIAGNOSIS — Z950 Presence of cardiac pacemaker: Secondary | ICD-10-CM | POA: Diagnosis not present

## 2023-12-26 NOTE — Progress Notes (Signed)
 Cardiology Office Note    Date:  12/26/2023   ID:  Sherry Davis, Sherry Davis 14-Jul-1951, MRN [id number]  PCP:  Imelda Man, MD  Cardiologist:   Luana Rumple, MD   Chief complaint: Follow-up neurocardiogenic syncope, pacemaker check, hyperlipidemia   History of Present Illness:  Sherry Davis is a 73 y.o. female with history of neurocardiogenic syncope and with cardioinhibitory component for which received a pacemaker in 2010 also followed for hyperlipidemia.  Show uncomplicated surgery for hyperparathyroidism last year.  She has done quite well since her last office visit.  She has not had syncope since her pacemaker was implanted in 2010.  She retired last September. The patient specifically denies any chest pain at rest or with exertion, dyspnea at rest or with exertion, orthopnea, paroxysmal nocturnal dyspnea, syncope, palpitations, focal neurological deficits, intermittent claudication, lower extremity edema, unexplained weight gain, cough, hemoptysis or wheezing.  Her device is a dual-chamber Medtronic Adapta device implanted in 2010 but still has just under 2 years of estimated longevity.  The leads are both Medtronic 5076 leads implanted in 2010.  All lead parameters are in normal range.  Pacemaker interrogation today shows normal device function.  She only requires roughly 20% atrial pacing and never requires ventricular pacing.  Presenting rhythm is atrial sensed, ventricular sensed (normal sinus rhythm).  She has very rare and brief episodes of increased atrial rates which may represent brief atrial tachycardia versus intermittent far field R wave oversensing.  There has been no atrial fibrillation and have been no high ventricular rates.   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   Medtroic Adapta   Sinoatrial arrest with nodal/ventricular escape [facility])    Syncope      Past Surgical History:  Procedure Laterality Date   BREAST EXCISIONAL BIOPSY Right 2000   BREAST LUMPECTOMY     COLONOSCOPY  02/23/2015   DILATION AND CURETTAGE OF UTERUS     PARATHYROIDECTOMY Left 04/27/2022   Procedure: LEFT INFERIOR PARATHYROIDECTOMY;  Surgeon: Oralee Billow, MD;  Location: [facility];  Service: General;  Laterality: Left;   PERMANENT PACEMAKER INSERTION  08/17/2008   Medtronic adapta    Current Medications: Outpatient Medications Prior to Visit  Medication Sig Dispense Refill   Cholecalciferol (VITAMIN D3 HIGH POTENCY PO) Take 1,000 Units by mouth 3 (three) times a week.     cyanocobalamin 100 MCG tablet Take 100 mcg by mouth daily.     ezetimibe (ZETIA) 10 MG tablet Take 5 mg by mouth daily.     levothyroxine (SYNTHROID, LEVOTHROID) 50 MCG tablet Take 50 mcg by mouth daily before breakfast.     rosuvastatin (CRESTOR) 20 MG tablet Take 20 mg by mouth daily.     No facility-administered medications prior to visit.     Allergies:   Penicillins   Family History:  The patient's family history includes Hyperlipidemia in her father and mother; Hypertension in her brother and mother.   ROS:   Please see the history of present illness.    ROS All other systems are reviewed and are negative.   PHYSICAL EXAM:   VS:  BP 138/70 (BP Location: Left Arm, Patient Position: Sitting)   Pulse 79   Ht 5\' 1"  (1.549 m)   Wt 64.5 kg   SpO2 97%   BMI 26.87 kg/m      General: Alert, oriented x3, no distress, healthy left  subclavian pacemaker site Head: no evidence of trauma, PERRL, EOMI, no exophtalmos or lid lag, no myxedema, no xanthelasma; normal ears, nose and oropharynx Neck: normal jugular venous pulsations and no hepatojugular reflux; brisk carotid pulses without delay and no carotid bruits Chest: clear to auscultation, no signs of consolidation by percussion or palpation, normal fremitus, symmetrical and full respiratory excursions Cardiovascular: normal position and  quality of the apical impulse, regular rhythm, normal first and second heart sounds, no murmurs, rubs or gallops Abdomen: no tenderness or distention, no masses by palpation, no abnormal pulsatility or arterial bruits, normal bowel sounds, no hepatosplenomegaly Extremities: no clubbing, cyanosis or edema; 2+ radial, ulnar and brachial pulses bilaterally; 2+ right femoral, posterior tibial and dorsalis pedis pulses; 2+ left femoral, posterior tibial and dorsalis pedis pulses; no subclavian or femoral bruits Neurological: grossly nonfocal Psych: Normal mood and affect     Wt Readings from Last 3 Encounters:  12/26/23 64.5 kg  12/24/22 65.7 kg  04/27/22 62.1 kg      Studies/Labs Reviewed:   EKG:    EKG Interpretation Date/Time:  Thursday Dec 26 2023 08:12:06 EDT Ventricular Rate:  79 PR Interval:  132 QRS Duration:  72 QT Interval:  370 QTC Calculation: 424 R Axis:   79  Text Interpretation: Normal sinus rhythm Normal ECG When compared with ECG of 17-Aug-2008 22:15, No significant change was found Confirmed by Charlene Cowdrey ([id number]) on 12/26/2023 8:36:50 AM        Recent Labs: 12/23/2018 total cholesterol 128, HDL 46, LDL 63, triglycerides 96, creatinine 0.9, hemoglobin 14.1, TSH 1.41  12/23/2019 Cholesterol 136, HDL 39, LDL 78, triglycerides 104, TSH 2.75 (04/08/2020), creatinine 0.87, normal liver function tests  12/30/2020 total cholesterol 135, HDL 43, LDL 76, triglycerides 108  01/10/2023 Total cholesterol 148, HDL 44, LDL 79, triglycerides 141, Creatinine 0.92, ALT 25  08/20/2023 TSH 2.63      ASSESSMENT:    1. Pacemaker      PLAN:  In order of problems listed above:  Neurocardiogenic syncope: She has not had full-blown syncope since the pacemaker implantation over the last 12 months has not even had episodes of near syncope. PPM: Normal device function.  Still has approximately 2 years of device generator longevity remaining.  Continue remote  downloads every 3 months.  She asked whether she should not have the generator change sooner as a precaution.  We discussed the fact that she is not pacemaker dependent and the device will predictably give us  90 days of warning before the battery needs to be changed. HLP: Lipid parameters in target range, due for repeat labs with Dr. Lucina Sabal next month. Hypothyroidism: Euthyroid clinically and normal TSH in January. Hypercalcemia/primary hyperparathyroidism: Most recent labs showed a slight increase in intact parathyroid  hormone, but calcium was at the upper limit of normal 10.2. Pre-HTN: Borderline BP, do not plan to add medicines, but advised avoiding sodium rich foods.  If she does require antihypertensive medications would avoid thiazide diuretics due to hypercalcemia.    Medication Adjustments/Labs and Tests Ordered: Current medicines are reviewed at length with the patient today.  Concerns regarding medicines are outlined above.  Medication changes, Labs and Tests ordered today are listed in the Patient Instructions below. Patient Instructions  Medication Instructions:  Your physician recommends that you continue on your current medications as directed. Please refer to the Current Medication list given to you today.  *If you need a refill on your cardiac medications before your next appointment, please call your pharmacy*  Lab Work: NONE If you have labs (blood work) drawn today and your tests are completely normal, you will receive your results only by: MyChart Message (if you have MyChart) OR A paper copy in the mail If you have any lab test that is abnormal or we need to change your treatment, we will call you to review the results.  Testing/Procedures: NONE  Follow-Up: At [facility], you and your health needs are our priority.  As part of our continuing mission to provide you with exceptional heart care, our providers are all part of one team.  This team includes your  primary Cardiologist (physician) and Advanced Practice Providers or APPs (Physician Assistants and Nurse Practitioners) who all work together to provide you with the care you need, when you need it.  Your next appointment:   1 year(s)  Provider:   You may see DR. Marcin Holte or one of the following Advanced Practice Providers on your designated Care Team:   Mertha Abrahams, Kennard Pea [address] [place], PA-C Suzann Riddle, NP Creighton Doffing, NP    We recommend signing up for the patient portal called "MyChart".  Sign up information is provided on this After Visit Summary.  MyChart is used to connect with patients for Virtual Visits (Telemedicine).  Patients are able to view lab/test results, encounter notes, upcoming appointments, etc.  Non-urgent messages can be sent to your provider as well.   To learn more about what you can do with MyChart, go to [email or web address].   Other Instructions        Signed, Luana Rumple, MD  12/26/2023 8:37 AM    [facility] [address] [place], [place], [place]  [address] Phone: [phone]; Fax: [phone]

## 2023-12-26 NOTE — Patient Instructions (Signed)
 Medication Instructions:  Your physician recommends that you continue on your current medications as directed. Please refer to the Current Medication list given to you today.  *If you need a refill on your cardiac medications before your next appointment, please call your pharmacy*  Lab Work: NONE If you have labs (blood work) drawn today and your tests are completely normal, you will receive your results only by: MyChart Message (if you have MyChart) OR A paper copy in the mail If you have any lab test that is abnormal or we need to change your treatment, we will call you to review the results.  Testing/Procedures: NONE  Follow-Up: At [facility], you and your health needs are our priority.  As part of our continuing mission to provide you with exceptional heart care, our providers are all part of one team.  This team includes your primary Cardiologist (physician) and Advanced Practice Providers or APPs (Physician Assistants and Nurse Practitioners) who all work together to provide you with the care you need, when you need it.  Your next appointment:   1 year(s)  Provider:   You may see DR. CROITORU or one of the following Advanced Practice Providers on your designated Care Team:   Mertha Abrahams, Kennard Pea [address] [place], PA-C Suzann Riddle, NP Creighton Doffing, NP    We recommend signing up for the patient portal called "MyChart".  Sign up information is provided on this After Visit Summary.  MyChart is used to connect with patients for Virtual Visits (Telemedicine).  Patients are able to view lab/test results, encounter notes, upcoming appointments, etc.  Non-urgent messages can be sent to your provider as well.   To learn more about what you can do with MyChart, go to [email or web address].   Other Instructions

## 2024-01-02 ENCOUNTER — Ambulatory Visit (INDEPENDENT_AMBULATORY_CARE_PROVIDER_SITE_OTHER): Payer: Managed Care, Other (non HMO)

## 2024-01-02 DIAGNOSIS — I495 Sick sinus syndrome: Secondary | ICD-10-CM | POA: Diagnosis not present

## 2024-01-03 LAB — CUP PACEART REMOTE DEVICE CHECK
Battery Impedance: 3582 Ohm
Battery Remaining Longevity: 22 mo
Battery Voltage: 2.69 V
Brady Statistic AP VP Percent: 0 %
Brady Statistic AP VS Percent: 31 %
Brady Statistic AS VP Percent: 0 %
Brady Statistic AS VS Percent: 69 %
Date Time Interrogation Session: 20250529163919
Implantable Lead Connection Status: 753985
Implantable Lead Connection Status: 753985
Implantable Lead Implant Date: 20100112
Implantable Lead Implant Date: 20100112
Implantable Lead Location: 753859
Implantable Lead Location: 753860
Implantable Lead Model: 5076
Implantable Lead Model: 5076
Implantable Pulse Generator Implant Date: 20100112
Lead Channel Impedance Value: 399 Ohm
Lead Channel Impedance Value: 554 Ohm
Lead Channel Pacing Threshold Amplitude: 0.5 V
Lead Channel Pacing Threshold Amplitude: 0.875 V
Lead Channel Pacing Threshold Pulse Width: 0.4 ms
Lead Channel Pacing Threshold Pulse Width: 0.4 ms
Lead Channel Setting Pacing Amplitude: 1.5 V
Lead Channel Setting Pacing Amplitude: 2 V
Lead Channel Setting Pacing Pulse Width: 0.4 ms
Lead Channel Setting Sensing Sensitivity: 5.6 mV
Zone Setting Status: 755011
Zone Setting Status: 755011

## 2024-01-08 ENCOUNTER — Ambulatory Visit: Payer: Self-pay | Admitting: Cardiovascular Disease

## 2024-01-15 DIAGNOSIS — E7800 Pure hypercholesterolemia, unspecified: Secondary | ICD-10-CM | POA: Diagnosis not present

## 2024-01-20 DIAGNOSIS — E039 Hypothyroidism, unspecified: Secondary | ICD-10-CM | POA: Diagnosis not present

## 2024-01-20 DIAGNOSIS — Z950 Presence of cardiac pacemaker: Secondary | ICD-10-CM | POA: Diagnosis not present

## 2024-01-20 DIAGNOSIS — E210 Primary hyperparathyroidism: Secondary | ICD-10-CM | POA: Diagnosis not present

## 2024-01-20 DIAGNOSIS — Z0000 Encounter for general adult medical examination without abnormal findings: Secondary | ICD-10-CM | POA: Diagnosis not present

## 2024-01-20 DIAGNOSIS — E559 Vitamin D deficiency, unspecified: Secondary | ICD-10-CM | POA: Diagnosis not present

## 2024-01-20 DIAGNOSIS — E789 Disorder of lipoprotein metabolism, unspecified: Secondary | ICD-10-CM | POA: Diagnosis not present

## 2024-01-20 DIAGNOSIS — M810 Age-related osteoporosis without current pathological fracture: Secondary | ICD-10-CM | POA: Diagnosis not present

## 2024-01-20 DIAGNOSIS — I2510 Atherosclerotic heart disease of native coronary artery without angina pectoris: Secondary | ICD-10-CM | POA: Diagnosis not present

## 2024-01-20 DIAGNOSIS — K219 Gastro-esophageal reflux disease without esophagitis: Secondary | ICD-10-CM | POA: Diagnosis not present

## 2024-02-03 ENCOUNTER — Ambulatory Visit (INDEPENDENT_AMBULATORY_CARE_PROVIDER_SITE_OTHER)

## 2024-02-03 DIAGNOSIS — I495 Sick sinus syndrome: Secondary | ICD-10-CM

## 2024-02-05 ENCOUNTER — Telehealth: Payer: Self-pay

## 2024-02-05 NOTE — Telephone Encounter (Signed)
 The pt is confused on why she is on monthly battery checks and wants to speak with someone. I let her speak with Randall, rn.

## 2024-02-06 ENCOUNTER — Ambulatory Visit: Payer: Self-pay | Admitting: Cardiovascular Disease

## 2024-02-06 LAB — CUP PACEART REMOTE DEVICE CHECK
Battery Impedance: 4134 Ohm
Battery Remaining Longevity: 17 mo
Battery Voltage: 2.68 V
Brady Statistic AP VP Percent: 0 %
Brady Statistic AP VS Percent: 29 %
Brady Statistic AS VP Percent: 0 %
Brady Statistic AS VS Percent: 71 %
Date Time Interrogation Session: 20250702152536
Implantable Lead Connection Status: 753985
Implantable Lead Connection Status: 753985
Implantable Lead Implant Date: 20100112
Implantable Lead Implant Date: 20100112
Implantable Lead Location: 753859
Implantable Lead Location: 753860
Implantable Lead Model: 5076
Implantable Lead Model: 5076
Implantable Pulse Generator Implant Date: 20100112
Lead Channel Impedance Value: 388 Ohm
Lead Channel Impedance Value: 506 Ohm
Lead Channel Pacing Threshold Amplitude: 0.5 V
Lead Channel Pacing Threshold Amplitude: 0.875 V
Lead Channel Pacing Threshold Pulse Width: 0.4 ms
Lead Channel Pacing Threshold Pulse Width: 0.4 ms
Lead Channel Setting Pacing Amplitude: 1.5 V
Lead Channel Setting Pacing Amplitude: 2 V
Lead Channel Setting Pacing Pulse Width: 0.4 ms
Lead Channel Setting Sensing Sensitivity: 5.6 mV
Zone Setting Status: 755011
Zone Setting Status: 755011

## 2024-02-21 NOTE — Addendum Note (Signed)
 Addended by: VICCI SELLER A on: 02/21/2024 12:24 PM   Modules accepted: Orders

## 2024-02-21 NOTE — Progress Notes (Signed)
 Remote pacemaker transmission.

## 2024-03-05 ENCOUNTER — Ambulatory Visit

## 2024-03-05 DIAGNOSIS — I495 Sick sinus syndrome: Secondary | ICD-10-CM | POA: Diagnosis not present

## 2024-03-06 LAB — CUP PACEART REMOTE DEVICE CHECK
Battery Impedance: 4795 Ohm
Battery Remaining Longevity: 11 mo
Battery Voltage: 2.67 V
Brady Statistic AP VP Percent: 0 %
Brady Statistic AP VS Percent: 29 %
Brady Statistic AS VP Percent: 0 %
Brady Statistic AS VS Percent: 70 %
Date Time Interrogation Session: 20250731121024
Implantable Lead Connection Status: 753985
Implantable Lead Connection Status: 753985
Implantable Lead Implant Date: 20100112
Implantable Lead Implant Date: 20100112
Implantable Lead Location: 753859
Implantable Lead Location: 753860
Implantable Lead Model: 5076
Implantable Lead Model: 5076
Implantable Pulse Generator Implant Date: 20100112
Lead Channel Impedance Value: 407 Ohm
Lead Channel Impedance Value: 492 Ohm
Lead Channel Pacing Threshold Amplitude: 0.625 V
Lead Channel Pacing Threshold Amplitude: 1 V
Lead Channel Pacing Threshold Pulse Width: 0.4 ms
Lead Channel Pacing Threshold Pulse Width: 0.4 ms
Lead Channel Setting Pacing Amplitude: 1.5 V
Lead Channel Setting Pacing Amplitude: 2 V
Lead Channel Setting Pacing Pulse Width: 0.46 ms
Lead Channel Setting Sensing Sensitivity: 5.6 mV
Zone Setting Status: 755011
Zone Setting Status: 755011

## 2024-03-10 ENCOUNTER — Ambulatory Visit: Payer: Self-pay | Admitting: Cardiovascular Disease

## 2024-03-30 ENCOUNTER — Other Ambulatory Visit: Payer: Self-pay | Admitting: Gynecology

## 2024-03-30 DIAGNOSIS — Z1231 Encounter for screening mammogram for malignant neoplasm of breast: Secondary | ICD-10-CM

## 2024-04-06 ENCOUNTER — Ambulatory Visit (INDEPENDENT_AMBULATORY_CARE_PROVIDER_SITE_OTHER)

## 2024-04-06 DIAGNOSIS — I495 Sick sinus syndrome: Secondary | ICD-10-CM | POA: Diagnosis not present

## 2024-04-08 LAB — CUP PACEART REMOTE DEVICE CHECK
Battery Impedance: 5172 Ohm
Battery Remaining Longevity: 7 mo
Battery Voltage: 2.65 V
Brady Statistic AP VP Percent: 0 %
Brady Statistic AP VS Percent: 31 %
Brady Statistic AS VP Percent: 0 %
Brady Statistic AS VS Percent: 69 %
Date Time Interrogation Session: 20250901182817
Implantable Lead Connection Status: 753985
Implantable Lead Connection Status: 753985
Implantable Lead Implant Date: 20100112
Implantable Lead Implant Date: 20100112
Implantable Lead Location: 753859
Implantable Lead Location: 753860
Implantable Lead Model: 5076
Implantable Lead Model: 5076
Implantable Pulse Generator Implant Date: 20100112
Lead Channel Impedance Value: 402 Ohm
Lead Channel Impedance Value: 533 Ohm
Lead Channel Pacing Threshold Amplitude: 0.5 V
Lead Channel Pacing Threshold Amplitude: 0.875 V
Lead Channel Pacing Threshold Pulse Width: 0.4 ms
Lead Channel Pacing Threshold Pulse Width: 0.4 ms
Lead Channel Setting Pacing Amplitude: 1.5 V
Lead Channel Setting Pacing Amplitude: 2 V
Lead Channel Setting Pacing Pulse Width: 0.64 ms
Lead Channel Setting Sensing Sensitivity: 5.6 mV
Zone Setting Status: 755011
Zone Setting Status: 755011

## 2024-04-14 NOTE — Progress Notes (Signed)
 Remote PPM Transmission

## 2024-04-16 ENCOUNTER — Ambulatory Visit: Payer: Self-pay | Admitting: Cardiovascular Disease

## 2024-04-23 ENCOUNTER — Ambulatory Visit

## 2024-04-23 ENCOUNTER — Ambulatory Visit
Admission: RE | Admit: 2024-04-23 | Discharge: 2024-04-23 | Disposition: A | Discharge status: Home / Self Care | Source: Ambulatory Visit | Attending: Gynecology | Admitting: Gynecology

## 2024-04-23 DIAGNOSIS — Z1231 Encounter for screening mammogram for malignant neoplasm of breast: Secondary | ICD-10-CM | POA: Diagnosis not present

## 2024-05-06 NOTE — Progress Notes (Signed)
 Remote PPM Transmission

## 2024-05-07 ENCOUNTER — Encounter

## 2024-05-07 LAB — CUP PACEART REMOTE DEVICE CHECK
Battery Impedance: 6033 Ohm
Battery Remaining Longevity: 2 mo
Battery Voltage: 2.63 V
Brady Statistic AP VP Percent: 0 %
Brady Statistic AP VS Percent: 32 %
Brady Statistic AS VP Percent: 0 %
Brady Statistic AS VS Percent: 68 %
Date Time Interrogation Session: 20251002084316
Implantable Lead Connection Status: 753985
Implantable Lead Connection Status: 753985
Implantable Lead Implant Date: 20100112
Implantable Lead Implant Date: 20100112
Implantable Lead Location: 753859
Implantable Lead Location: 753860
Implantable Lead Model: 5076
Implantable Lead Model: 5076
Implantable Pulse Generator Implant Date: 20100112
Lead Channel Impedance Value: 403 Ohm
Lead Channel Impedance Value: 509 Ohm
Lead Channel Pacing Threshold Amplitude: 0.5 V
Lead Channel Pacing Threshold Amplitude: 1.125 V
Lead Channel Pacing Threshold Pulse Width: 0.4 ms
Lead Channel Pacing Threshold Pulse Width: 0.4 ms
Lead Channel Setting Pacing Amplitude: 1.5 V
Lead Channel Setting Pacing Amplitude: 2.25 V
Lead Channel Setting Pacing Pulse Width: 0.4 ms
Lead Channel Setting Sensing Sensitivity: 5.6 mV
Zone Setting Status: 755011
Zone Setting Status: 755011

## 2024-05-08 ENCOUNTER — Ambulatory Visit: Payer: Self-pay | Admitting: Cardiovascular Disease

## 2024-05-14 DIAGNOSIS — H52223 Regular astigmatism, bilateral: Secondary | ICD-10-CM | POA: Diagnosis not present

## 2024-05-14 DIAGNOSIS — H25012 Cortical age-related cataract, left eye: Secondary | ICD-10-CM | POA: Diagnosis not present

## 2024-05-14 DIAGNOSIS — H5213 Myopia, bilateral: Secondary | ICD-10-CM | POA: Diagnosis not present

## 2024-05-14 DIAGNOSIS — H524 Presbyopia: Secondary | ICD-10-CM | POA: Diagnosis not present

## 2024-05-14 DIAGNOSIS — H2511 Age-related nuclear cataract, right eye: Secondary | ICD-10-CM | POA: Diagnosis not present

## 2024-05-14 DIAGNOSIS — H40053 Ocular hypertension, bilateral: Secondary | ICD-10-CM | POA: Diagnosis not present

## 2024-06-03 NOTE — Progress Notes (Signed)
 Remote pacemaker transmission.

## 2024-06-03 NOTE — Addendum Note (Signed)
 Addended by: VICCI SELLER A on: 06/03/2024 09:39 AM   Modules accepted: Orders, Level of Service

## 2024-06-08 ENCOUNTER — Ambulatory Visit

## 2024-06-08 DIAGNOSIS — I495 Sick sinus syndrome: Secondary | ICD-10-CM

## 2024-06-09 LAB — CUP PACEART REMOTE DEVICE CHECK
Battery Impedance: 6606 Ohm
Battery Remaining Longevity: 1 mo
Battery Voltage: 2.62 V
Brady Statistic AP VP Percent: 0 %
Brady Statistic AP VS Percent: 31 %
Brady Statistic AS VP Percent: 0 %
Brady Statistic AS VS Percent: 68 %
Date Time Interrogation Session: 20251103141204
Implantable Lead Connection Status: 753985
Implantable Lead Connection Status: 753985
Implantable Lead Implant Date: 20100112
Implantable Lead Implant Date: 20100112
Implantable Lead Location: 753859
Implantable Lead Location: 753860
Implantable Lead Model: 5076
Implantable Lead Model: 5076
Implantable Pulse Generator Implant Date: 20100112
Lead Channel Impedance Value: 410 Ohm
Lead Channel Impedance Value: 505 Ohm
Lead Channel Pacing Threshold Amplitude: 0.5 V
Lead Channel Pacing Threshold Amplitude: 1 V
Lead Channel Pacing Threshold Pulse Width: 0.4 ms
Lead Channel Pacing Threshold Pulse Width: 0.4 ms
Lead Channel Setting Pacing Amplitude: 1.5 V
Lead Channel Setting Pacing Amplitude: 2 V
Lead Channel Setting Pacing Pulse Width: 0.4 ms
Lead Channel Setting Sensing Sensitivity: 5.6 mV
Zone Setting Status: 755011
Zone Setting Status: 755011

## 2024-06-11 NOTE — Progress Notes (Signed)
 Remote PPM Transmission

## 2024-06-13 ENCOUNTER — Ambulatory Visit: Payer: Self-pay | Admitting: Cardiovascular Disease

## 2024-06-24 ENCOUNTER — Telehealth: Payer: Self-pay | Admitting: Emergency Medicine

## 2024-06-24 DIAGNOSIS — Z950 Presence of cardiac pacemaker: Secondary | ICD-10-CM

## 2024-06-24 DIAGNOSIS — Z01812 Encounter for preprocedural laboratory examination: Secondary | ICD-10-CM

## 2024-06-24 NOTE — Telephone Encounter (Signed)
 S/w the patient- went over all instructions. She will pick up chg soap- it will be at the coumadin clinic desk for her to pick up when she gets her lab work.  She verbalized understanding of all information.

## 2024-06-24 NOTE — Telephone Encounter (Signed)
 Called the patient to go over Pacemaker Gen change instructions. She asked if I can call her back later today to discuss due to just getting her mother home from an appointment. I will call her back after clinic is over today.     Implantable Device Instructions    Sherry Davis  06/24/2024  You are scheduled for a PPM generator change on Friday, December 12 with Dr. Jerel Croitoru.  1. Pre procedure Lab testing: CBC, BMP- You will come to the [facility]. office [facility], [address] [place], 1st Floor) on Tuesday, December 9 any time between 8:00 am and 4:30 pm.  You do NOT need to be fasting. (You can have this drawn at any [facility] location)  2. Please arrive at the [facility] (Main Entrance A) at [facility]: [address] [place], [place] [address] at 11:30 AM (This time is 2 hour(s) before your procedure to ensure your preparation).   Free valet parking service is available. You will check in at ADMITTING. The support person will be asked to wait in the waiting room.  It is OK to have someone drop you off and come back when you are ready to be discharged.          Special note: Every effort is made to have your procedure done on time. Please understand that emergencies sometimes delay  scheduled procedures.  3.  No eating or drinking after midnight prior to procedure.     4.  Medication instructions:  On the morning of your procedure you can take all prescribed medications with sips of water    !!IF ANY NEW MEDICATIONS ARE STARTED AFTER TODAY, PLEASE NOTIFY YOUR PROVIDER AS SOON AS POSSIBLE!!  FYI: Medications such as Semaglutide (Ozempic, Wegovy), Tirzepatide (Mounjaro, Zepbound), Dulaglutide (Trulicity), etc (GLP1 agonists) AND Canagliflozin (Invokana), Dapagliflozin (Farxiga), Empagliflozin (Jardiance), Ertugliflozin (Steglatro), Bexagliflozin [employer]) or any combination with one of these drugs such as  Invokamet (Canagliflozin/Metformin), Synjardy (Empagliflozin/Metformin), etc (SGLT2 inhibitors) must be held around the time of a procedure. This is not a comprehensive list of all of these drugs. Please review all of your medications and talk to your provider if you take any one of these. If you are not sure, ask your provider.   5.  The night before your procedure and the morning of your procedure scrub your neck/chest with CHG surgical scrub.  See instruction letter.  6. Plan to go home the same day, you will only stay overnight if medically necessary. 7. You MUST have a responsible adult to drive you home. 8. An adult MUST be with you the first 24 hours after you arrive home. 9. Bring a current list of your medications, and the last time and date medication taken. 10. Bring ID and current insurance cards. 11. Please wear clothes that are easy to get on and off and wear slip-on shoes.    You will follow up with the [facility] Device clinic 10-14 days after your procedure.  You will follow up with Dr. Jerel Croitoru 91 days after your procedure.  These appointments will be made for you.   * If you have ANY questions after you get home, please call the office at [phone] or send a MyChart message.  FYI: For your safety, and to allow us  to monitor your vital signs accurately during the surgery/procedure we request that if you have artificial nails, gel coating, SNS etc. Please have  those removed prior to your surgery/procedure. Not having the nail coverings /polish removed may result in cancellation or delay of your surgery/procedure.     - Preparing For Surgery    Before surgery, you can play an important role. Because skin is not sterile, your skin needs to be as free of germs as possible. You can reduce the number of germs on your skin by washing with CHG (chlorahexidine gluconate) Soap before surgery.  CHG is an antiseptic cleaner which kills germs and bonds with the skin to  continue killing germs even after washing.  Please do not use if you have an allergy to CHG or antibacterial soaps.  If your skin becomes reddened/irritated stop using the CHG.   Do not shave (including legs and underarms) for at least 48 hours prior to first CHG shower.  It is OK to shave your face.  Please follow these instructions carefully:  1.  Shower the night before surgery and the morning of surgery with CHG.  2.  If you choose to wash your hair, wash your hair first as usual with your normal shampoo.  3.  After you shampoo, rinse your hair and body thoroughly to remove the shampoo.  4.  Use CHG as you would any other liquid soap.  You can apply CHG directly to the skin and wash gently with a clean washcloth. 5.  Apply the CHG Soap to your body ONLY FROM THE NECK DOWN.  Do not use on open wounds or open sores.  Avoid contact with your eyes, ears, mouth and genitals (private parts).  Wash genitals (private parts) with your normal soap.  6.  Wash thoroughly, paying special attention to the area where your surgery will be performed.  7.  Thoroughly rinse your body with warm water from the neck down.   8.  DO NOT shower/wash with your normal soap after using and rinsing off the CHG soap.  9.  Pat yourself dry with a clean towel.           10.  Wear clean pajamas.           11.  Place clean sheets on your bed the night of your first shower and do not sleep with pets.  Day of Surgery: Do not apply any deodorants/lotions.  Please wear clean clothes to the hospital/surgery center.

## 2024-06-30 ENCOUNTER — Ambulatory Visit: Payer: Self-pay | Admitting: Cardiovascular Disease

## 2024-06-30 LAB — CBC
Hematocrit: 43.4 % (ref 34.0–46.6)
Hemoglobin: 14.4 g/dL (ref 11.1–15.9)
MCH: 30.6 pg (ref 26.6–33.0)
MCHC: 33.2 g/dL (ref 31.5–35.7)
MCV: 92 fL (ref 79–97)
Platelets: 230 x10E3/uL (ref 150–450)
RBC: 4.71 x10E6/uL (ref 3.77–5.28)
RDW: 12.6 % (ref 11.7–15.4)
WBC: 7.1 x10E3/uL (ref 3.4–10.8)

## 2024-06-30 LAB — BASIC METABOLIC PANEL WITH GFR
BUN/Creatinine Ratio: 14 (ref 12–28)
BUN: 13 mg/dL (ref 8–27)
CO2: 23 mmol/L (ref 20–29)
Calcium: 10.4 mg/dL — AB (ref 8.7–10.3)
Chloride: 100 mmol/L (ref 96–106)
Creatinine, Ser: 0.94 mg/dL (ref 0.57–1.00)
Glucose: 90 mg/dL (ref 70–99)
Potassium: 4.1 mmol/L (ref 3.5–5.2)
Sodium: 140 mmol/L (ref 134–144)
eGFR: 65 mL/min/1.73 (ref >59)

## 2024-07-09 ENCOUNTER — Ambulatory Visit: Attending: Cardiovascular Disease

## 2024-07-09 ENCOUNTER — Encounter

## 2024-07-11 LAB — CUP PACEART REMOTE DEVICE CHECK
Battery Impedance: 7948 Ohm
Battery Voltage: 2.59 V
Brady Statistic RV Percent Paced: 0 %
Date Time Interrogation Session: 20251204150347
Implantable Lead Connection Status: 753985
Implantable Lead Connection Status: 753985
Implantable Lead Implant Date: 20100112
Implantable Lead Implant Date: 20100112
Implantable Lead Location: 753859
Implantable Lead Location: 753860
Implantable Lead Model: 5076
Implantable Lead Model: 5076
Implantable Pulse Generator Implant Date: 20100112
Lead Channel Impedance Value: 507 Ohm
Lead Channel Impedance Value: 67 Ohm
Lead Channel Setting Pacing Amplitude: 2.25 V
Lead Channel Setting Pacing Pulse Width: 0.4 ms
Lead Channel Setting Sensing Sensitivity: 5.6 mV
Zone Setting Status: 755011
Zone Setting Status: 755011

## 2024-07-13 ENCOUNTER — Telehealth: Payer: Self-pay

## 2024-07-13 NOTE — Telephone Encounter (Signed)
 PPM has officially reached ERI on 06/26/24. She is in back up pacing mode of VVI 65bpm.  Appears patient is scheduled for gen change on 12/12.  Unsure if needs clinic visit first.  Forwarding to K. Davee, RN for Dr Francyne.

## 2024-07-13 NOTE — Telephone Encounter (Signed)
 Left message stating that a MyChart visit is scheduled for 07/14/24 at 10:00 with Dr Francyne to discuss procedure and complete a H&P. Left call back number. Will send a MyChart message as well.

## 2024-07-13 NOTE — Telephone Encounter (Signed)
 Patient returned call and she said she would rather do an in person visit instead of a mychart. Appt changed to in person. She will come to appt 07/14/24 at 10am

## 2024-07-14 ENCOUNTER — Encounter: Payer: Self-pay | Admitting: Cardiovascular Disease

## 2024-07-14 ENCOUNTER — Ambulatory Visit: Payer: Self-pay | Admitting: Cardiovascular Disease

## 2024-07-14 ENCOUNTER — Ambulatory Visit: Attending: Cardiovascular Disease | Admitting: Cardiovascular Disease

## 2024-07-14 VITALS — BP 141/60 | HR 74 | Ht 61.0 in | Wt 141.4 lb

## 2024-07-14 DIAGNOSIS — E039 Hypothyroidism, unspecified: Secondary | ICD-10-CM

## 2024-07-14 DIAGNOSIS — R030 Elevated blood-pressure reading, without diagnosis of hypertension: Secondary | ICD-10-CM

## 2024-07-14 DIAGNOSIS — E7800 Pure hypercholesterolemia, unspecified: Secondary | ICD-10-CM

## 2024-07-14 DIAGNOSIS — E210 Primary hyperparathyroidism: Secondary | ICD-10-CM

## 2024-07-14 DIAGNOSIS — Z45010 Encounter for checking and testing of cardiac pacemaker pulse generator [battery]: Secondary | ICD-10-CM

## 2024-07-14 DIAGNOSIS — R55 Syncope and collapse: Secondary | ICD-10-CM

## 2024-07-14 NOTE — Progress Notes (Signed)
 Cardiology Office Note    Date:  07/14/2024   ID:  Sherry Davis, Sherry Davis 07-29-1951, MRN [id number]  PCP:  Clarice Nottingham, MD  Cardiologist:   Jerel Balding, MD   Chief complaint: Follow-up neurocardiogenic syncope, pacemaker check, hyperlipidemia   History of Present Illness:  Sherry Davis is a 73 y.o. female with history of neurocardiogenic syncope and with cardioinhibitory component for which received a pacemaker in 2010 also followed for hyperlipidemia.  She had uncomplicated surgery for hyperparathyroidism last year.  Her pacemaker has just reached ERI.  She has a Medtronic Adapta device that was implanted in 2010 with Medtronic 5076 atrial and ventricular leads.  She is not pacemaker dependent.  The device has switched to VVI 65 bpm mode, but thankfully she is not experiencing pacemaker syndrome since her current heart rate is sinus rhythm is around 70 bpm.  She has noticed some unusual palpitations when she is relaxed at night or sitting down during the day.  Prior to the mode switch she was pacing the atria roughly 20% of the time and she never requires ventricular pacing.   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   Medtroic Adapta   Sinoatrial arrest with nodal/ventricular escape [facility])    Syncope     Past Surgical History:  Procedure Laterality Date   BREAST EXCISIONAL BIOPSY Right 2000   BREAST LUMPECTOMY     COLONOSCOPY  02/23/2015   DILATION AND CURETTAGE OF UTERUS     PARATHYROIDECTOMY Left 04/27/2022   Procedure: LEFT INFERIOR PARATHYROIDECTOMY;  Surgeon: Sherry Boas, MD;  Location: [facility];  Service: General;  Laterality: Left;   PERMANENT PACEMAKER INSERTION  08/17/2008   Medtronic adapta    Current Medications: Outpatient Medications Prior to Visit  Medication Sig Dispense Refill   Cholecalciferol (VITAMIN D3 HIGH POTENCY PO) Take 1,000 Units  by mouth 3 (three) times a week.     cyanocobalamin 1000 MCG tablet Take 1,000 mcg by mouth daily.     ezetimibe (ZETIA) 10 MG tablet Take 5 mg by mouth daily.     levothyroxine (SYNTHROID, LEVOTHROID) 50 MCG tablet Take 50 mcg by mouth daily before breakfast.     rosuvastatin (CRESTOR) 20 MG tablet Take 20 mg by mouth daily.     No facility-administered medications prior to visit.     Allergies:   Penicillins   Family History:  The patient's family history includes Hyperlipidemia in her father and mother; Hypertension in her brother and mother.   ROS:   Please see the history of present illness.    ROS All other systems are reviewed and are negative.   PHYSICAL EXAM:   VS:  BP (!) 141/60   Pulse 74   Ht 5' 1 (1.549 m)   Wt 141 lb 6.4 oz (64.1 kg)   SpO2 99%   BMI 26.72 kg/m      General: Alert, oriented x3, no distress, healthy left subclavian pacemaker site Head: no evidence of trauma, PERRL, EOMI, no exophtalmos or lid lag, no myxedema, no xanthelasma; normal ears, nose and oropharynx Neck: normal jugular venous pulsations and no hepatojugular reflux; brisk carotid pulses without delay and no carotid bruits Chest: clear to auscultation, no signs of consolidation by percussion or palpation, normal fremitus, symmetrical and full respiratory excursions Cardiovascular: normal position and quality of the apical impulse, regular rhythm, normal first and second heart sounds,  no murmurs, rubs or gallops Abdomen: no tenderness or distention, no masses by palpation, no abnormal pulsatility or arterial bruits, normal bowel sounds, no hepatosplenomegaly Extremities: no clubbing, cyanosis or edema; 2+ radial, ulnar and brachial pulses bilaterally; 2+ right femoral, posterior tibial and dorsalis pedis pulses; 2+ left femoral, posterior tibial and dorsalis pedis pulses; no subclavian or femoral bruits Neurological: grossly nonfocal Psych: Normal mood and affect    Wt Readings from Last 3  Encounters:  07/14/24 141 lb 6.4 oz (64.1 kg)  12/26/23 142 lb 3.2 oz (64.5 kg)  12/24/22 144 lb 12.8 oz (65.7 kg)      Studies/Labs Reviewed:   EKG:    EKG Interpretation Date/Time:  Tuesday July 14 2024 09:58:09 EST Ventricular Rate:  74 PR Interval:  148 QRS Duration:  74 QT Interval:  374 QTC Calculation: 415 R Axis:   74  Text Interpretation: Normal sinus rhythm with sinus arrhythmia Normal ECG When compared with ECG of 26-Dec-2023 08:12, No significant change was found Confirmed by Taaliyah Delpriore ([id number]) on 07/14/2024 10:31:41 AM        Recent Labs: 12/23/2018 total cholesterol 128, HDL 46, LDL 63, triglycerides 96, creatinine 0.9, hemoglobin 14.1, TSH 1.41  12/23/2019 Cholesterol 136, HDL 39, LDL 78, triglycerides 104, TSH 2.75 (04/08/2020), creatinine 0.87, normal liver function tests  12/30/2020 total cholesterol 135, HDL 43, LDL 76, triglycerides 108  01/10/2023 Total cholesterol 148, HDL 44, LDL 79, triglycerides 141, Creatinine 0.92, ALT 25  08/20/2023 TSH 2.63 HYPERTENSION CONTROL Vitals:   07/14/24 1002 07/14/24 1348  BP: (!) 144/62 (!) 141/60    The patient's blood pressure is elevated above target today.  In order to address the patient's elevated BP: Blood pressure will be monitored at home to determine if medication changes need to be made.       ASSESSMENT:    1. Neurocardiogenic syncope   2. Pacemaker battery depletion   3. Hypercholesterolemia   4. Acquired hypothyroidism   5. Hyperparathyroidism, primary   6. Elevated BP reading w/ no diagnosis of HTN      PLAN:  In order of problems listed above:  Neurocardiogenic syncope: Since the device was implanted 15 years ago she has not had any episodes of full-blown syncope.  Over the years she has had occasional episodes of near syncope which she has been able to prevent by laying down or sitting down. PPM:  not pacemaker dependent.  Device has reached ERI.  Scheduled for  generator change out in a couple of days.  Discussed the risks and potential complications of generator change out in detail.  In particular reviewed the potential risk for infection, also the much lower risk of lead injury and need for lead replacement. HLP: Lipid parameters are all in acceptable range.  Continue rosuvastatin Hypothyroidism: Appears euthyroid clinically and had a normal TSH in June. Hypercalcemia/primary hyperparathyroidism: Most recent labs show calcium just slightly above the upper limit of normal at 10.4. Pre-HTN: Once again she has borderline blood pressure.  No changes made to her medications, encourage a lower sodium diet    Medication Adjustments/Labs and Tests Ordered: Current medicines are reviewed at length with the patient today.  Concerns regarding medicines are outlined above.  Medication changes, Labs and Tests ordered today are listed in the Patient Instructions below. Patient Instructions  Medication Instructions:  No changes *If you need a refill on your cardiac medications before your next appointment, please call your pharmacy*  Lab Work: None ordered If you have labs (  blood work) drawn today and your tests are completely normal, you will receive your results only by: MyChart Message (if you have MyChart) OR A paper copy in the mail If you have any lab test that is abnormal or we need to change your treatment, we will call you to review the results.  Testing/Procedures: None ordered  Follow-Up: At [facility], you and your health needs are our priority.  As part of our continuing mission to provide you with exceptional heart care, our providers are all part of one team.  This team includes your primary Cardiologist (physician) and Advanced Practice Providers or APPs (Physician Assistants and Nurse Practitioners) who all work together to provide you with the care you need, when you need it.  Your next appointment:   07/27/24 at 3:20 for Wound  check- Device clinic  10/22/24 at 0900 with Dr Francyne  We recommend signing up for the patient portal called MyChart.  Sign up information is provided on this After Visit Summary.  MyChart is used to connect with patients for Virtual Visits (Telemedicine).  Patients are able to view lab/test results, encounter notes, upcoming appointments, etc.  Non-urgent messages can be sent to your provider as well.   To learn more about what you can do with MyChart, go to [email or web address].      Signed, Jerel Francyne, MD  07/14/2024 1:51 PM    [facility] [address] [place], [place], [place]  [address] Phone: [phone]; Fax: [phone]

## 2024-07-14 NOTE — H&P (View-Only) (Signed)
 Cardiology Office Note    Date:  07/14/2024   ID:  Sherry Davis, Sherry Davis 07-29-1951, MRN [id number]  PCP:  Clarice Nottingham, MD  Cardiologist:   Jerel Balding, MD   Chief complaint: Follow-up neurocardiogenic syncope, pacemaker check, hyperlipidemia   History of Present Illness:  Sherry Davis is a 73 y.o. female with history of neurocardiogenic syncope and with cardioinhibitory component for which received a pacemaker in 2010 also followed for hyperlipidemia.  She had uncomplicated surgery for hyperparathyroidism last year.  Her pacemaker has just reached ERI.  She has a Medtronic Adapta device that was implanted in 2010 with Medtronic 5076 atrial and ventricular leads.  She is not pacemaker dependent.  The device has switched to VVI 65 bpm mode, but thankfully she is not experiencing pacemaker syndrome since her current heart rate is sinus rhythm is around 70 bpm.  She has noticed some unusual palpitations when she is relaxed at night or sitting down during the day.  Prior to the mode switch she was pacing the atria roughly 20% of the time and she never requires ventricular pacing.   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   Medtroic Adapta   Sinoatrial arrest with nodal/ventricular escape [facility])    Syncope     Past Surgical History:  Procedure Laterality Date   BREAST EXCISIONAL BIOPSY Right 2000   BREAST LUMPECTOMY     COLONOSCOPY  02/23/2015   DILATION AND CURETTAGE OF UTERUS     PARATHYROIDECTOMY Left 04/27/2022   Procedure: LEFT INFERIOR PARATHYROIDECTOMY;  Surgeon: Eletha Boas, MD;  Location: [facility];  Service: General;  Laterality: Left;   PERMANENT PACEMAKER INSERTION  08/17/2008   Medtronic adapta    Current Medications: Outpatient Medications Prior to Visit  Medication Sig Dispense Refill   Cholecalciferol (VITAMIN D3 HIGH POTENCY PO) Take 1,000 Units  by mouth 3 (three) times a week.     cyanocobalamin 1000 MCG tablet Take 1,000 mcg by mouth daily.     ezetimibe (ZETIA) 10 MG tablet Take 5 mg by mouth daily.     levothyroxine (SYNTHROID, LEVOTHROID) 50 MCG tablet Take 50 mcg by mouth daily before breakfast.     rosuvastatin (CRESTOR) 20 MG tablet Take 20 mg by mouth daily.     No facility-administered medications prior to visit.     Allergies:   Penicillins   Family History:  The patient's family history includes Hyperlipidemia in her father and mother; Hypertension in her brother and mother.   ROS:   Please see the history of present illness.    ROS All other systems are reviewed and are negative.   PHYSICAL EXAM:   VS:  BP (!) 141/60   Pulse 74   Ht 5' 1 (1.549 m)   Wt 141 lb 6.4 oz (64.1 kg)   SpO2 99%   BMI 26.72 kg/m      General: Alert, oriented x3, no distress, healthy left subclavian pacemaker site Head: no evidence of trauma, PERRL, EOMI, no exophtalmos or lid lag, no myxedema, no xanthelasma; normal ears, nose and oropharynx Neck: normal jugular venous pulsations and no hepatojugular reflux; brisk carotid pulses without delay and no carotid bruits Chest: clear to auscultation, no signs of consolidation by percussion or palpation, normal fremitus, symmetrical and full respiratory excursions Cardiovascular: normal position and quality of the apical impulse, regular rhythm, normal first and second heart sounds,  no murmurs, rubs or gallops Abdomen: no tenderness or distention, no masses by palpation, no abnormal pulsatility or arterial bruits, normal bowel sounds, no hepatosplenomegaly Extremities: no clubbing, cyanosis or edema; 2+ radial, ulnar and brachial pulses bilaterally; 2+ right femoral, posterior tibial and dorsalis pedis pulses; 2+ left femoral, posterior tibial and dorsalis pedis pulses; no subclavian or femoral bruits Neurological: grossly nonfocal Psych: Normal mood and affect    Wt Readings from Last 3  Encounters:  07/14/24 141 lb 6.4 oz (64.1 kg)  12/26/23 142 lb 3.2 oz (64.5 kg)  12/24/22 144 lb 12.8 oz (65.7 kg)      Studies/Labs Reviewed:   EKG:    EKG Interpretation Date/Time:  Tuesday July 14 2024 09:58:09 EST Ventricular Rate:  74 PR Interval:  148 QRS Duration:  74 QT Interval:  374 QTC Calculation: 415 R Axis:   74  Text Interpretation: Normal sinus rhythm with sinus arrhythmia Normal ECG When compared with ECG of 26-Dec-2023 08:12, No significant change was found Confirmed by Aretha Levi ([id number]) on 07/14/2024 10:31:41 AM        Recent Labs: 12/23/2018 total cholesterol 128, HDL 46, LDL 63, triglycerides 96, creatinine 0.9, hemoglobin 14.1, TSH 1.41  12/23/2019 Cholesterol 136, HDL 39, LDL 78, triglycerides 104, TSH 2.75 (04/08/2020), creatinine 0.87, normal liver function tests  12/30/2020 total cholesterol 135, HDL 43, LDL 76, triglycerides 108  01/10/2023 Total cholesterol 148, HDL 44, LDL 79, triglycerides 141, Creatinine 0.92, ALT 25  08/20/2023 TSH 2.63 HYPERTENSION CONTROL Vitals:   07/14/24 1002 07/14/24 1348  BP: (!) 144/62 (!) 141/60    The patient's blood pressure is elevated above target today.  In order to address the patient's elevated BP: Blood pressure will be monitored at home to determine if medication changes need to be made.       ASSESSMENT:    1. Neurocardiogenic syncope   2. Pacemaker battery depletion   3. Hypercholesterolemia   4. Acquired hypothyroidism   5. Hyperparathyroidism, primary   6. Elevated BP reading w/ no diagnosis of HTN      PLAN:  In order of problems listed above:  Neurocardiogenic syncope: Since the device was implanted 15 years ago she has not had any episodes of full-blown syncope.  Over the years she has had occasional episodes of near syncope which she has been able to prevent by laying down or sitting down. PPM:  not pacemaker dependent.  Device has reached ERI.  Scheduled for  generator change out in a couple of days.  Discussed the risks and potential complications of generator change out in detail.  In particular reviewed the potential risk for infection, also the much lower risk of lead injury and need for lead replacement. HLP: Lipid parameters are all in acceptable range.  Continue rosuvastatin Hypothyroidism: Appears euthyroid clinically and had a normal TSH in June. Hypercalcemia/primary hyperparathyroidism: Most recent labs show calcium just slightly above the upper limit of normal at 10.4. Pre-HTN: Once again she has borderline blood pressure.  No changes made to her medications, encourage a lower sodium diet    Medication Adjustments/Labs and Tests Ordered: Current medicines are reviewed at length with the patient today.  Concerns regarding medicines are outlined above.  Medication changes, Labs and Tests ordered today are listed in the Patient Instructions below. Patient Instructions  Medication Instructions:  No changes *If you need a refill on your cardiac medications before your next appointment, please call your pharmacy*  Lab Work: None ordered If you have labs (  blood work) drawn today and your tests are completely normal, you will receive your results only by: MyChart Message (if you have MyChart) OR A paper copy in the mail If you have any lab test that is abnormal or we need to change your treatment, we will call you to review the results.  Testing/Procedures: None ordered  Follow-Up: At [facility], you and your health needs are our priority.  As part of our continuing mission to provide you with exceptional heart care, our providers are all part of one team.  This team includes your primary Cardiologist (physician) and Advanced Practice Providers or APPs (Physician Assistants and Nurse Practitioners) who all work together to provide you with the care you need, when you need it.  Your next appointment:   07/27/24 at 3:20 for Wound  check- Device clinic  10/22/24 at 0900 with Dr Francyne  We recommend signing up for the patient portal called MyChart.  Sign up information is provided on this After Visit Summary.  MyChart is used to connect with patients for Virtual Visits (Telemedicine).  Patients are able to view lab/test results, encounter notes, upcoming appointments, etc.  Non-urgent messages can be sent to your provider as well.   To learn more about what you can do with MyChart, go to [email or web address].      Signed, Jerel Francyne, MD  07/14/2024 1:51 PM    [facility] [address] [place], [place], [place]  [address] Phone: [phone]; Fax: [phone]

## 2024-07-14 NOTE — Patient Instructions (Signed)
 Medication Instructions:  No changes *If you need a refill on your cardiac medications before your next appointment, please call your pharmacy*  Lab Work: None ordered If you have labs (blood work) drawn today and your tests are completely normal, you will receive your results only by: MyChart Message (if you have MyChart) OR A paper copy in the mail If you have any lab test that is abnormal or we need to change your treatment, we will call you to review the results.  Testing/Procedures: None ordered  Follow-Up: At [facility], you and your health needs are our priority.  As part of our continuing mission to provide you with exceptional heart care, our providers are all part of one team.  This team includes your primary Cardiologist (physician) and Advanced Practice Providers or APPs (Physician Assistants and Nurse Practitioners) who all work together to provide you with the care you need, when you need it.  Your next appointment:   07/27/24 at 3:20 for Wound check- Device clinic  10/22/24 at 0900 with Dr Francyne  We recommend signing up for the patient portal called MyChart.  Sign up information is provided on this After Visit Summary.  MyChart is used to connect with patients for Virtual Visits (Telemedicine).  Patients are able to view lab/test results, encounter notes, upcoming appointments, etc.  Non-urgent messages can be sent to your provider as well.   To learn more about what you can do with MyChart, go to [email or web address].

## 2024-07-16 NOTE — Pre-Procedure Instructions (Signed)
Attempted to call patient regarding procedure instructions.  Left voicemail on the following items: Arrival time 1130 Nothing to eat or drink after midnight No meds AM of procedure Responsible person to drive you home and stay with you for 24 hrs Wash with special soap night before and morning of procedure

## 2024-07-17 ENCOUNTER — Ambulatory Visit (HOSPITAL_COMMUNITY)
Admission: RE | Admit: 2024-07-17 | Discharge: 2024-07-17 | Disposition: A | Attending: Cardiovascular Disease | Admitting: Cardiovascular Disease

## 2024-07-17 ENCOUNTER — Encounter (HOSPITAL_COMMUNITY): Admission: RE | Disposition: A | Payer: Self-pay | Attending: Cardiovascular Disease

## 2024-07-17 ENCOUNTER — Other Ambulatory Visit: Payer: Self-pay

## 2024-07-17 DIAGNOSIS — R55 Syncope and collapse: Secondary | ICD-10-CM | POA: Diagnosis not present

## 2024-07-17 DIAGNOSIS — E890 Postprocedural hypothyroidism: Secondary | ICD-10-CM | POA: Diagnosis not present

## 2024-07-17 DIAGNOSIS — R030 Elevated blood-pressure reading, without diagnosis of hypertension: Secondary | ICD-10-CM | POA: Insufficient documentation

## 2024-07-17 DIAGNOSIS — Z79899 Other long term (current) drug therapy: Secondary | ICD-10-CM | POA: Diagnosis not present

## 2024-07-17 DIAGNOSIS — E7800 Pure hypercholesterolemia, unspecified: Secondary | ICD-10-CM | POA: Insufficient documentation

## 2024-07-17 DIAGNOSIS — Z45010 Encounter for checking and testing of cardiac pacemaker pulse generator [battery]: Secondary | ICD-10-CM | POA: Insufficient documentation

## 2024-07-17 HISTORY — PX: PPM GENERATOR CHANGEOUT: EP1233

## 2024-07-17 SURGERY — PPM GENERATOR CHANGEOUT

## 2024-07-17 MED ORDER — LIDOCAINE HCL (PF) 1 % IJ SOLN
INTRAMUSCULAR | Status: AC
  Filled 2024-07-17: qty 60

## 2024-07-17 MED ORDER — LIDOCAINE HCL (PF) 1 % IJ SOLN
INTRAMUSCULAR | Status: DC | PRN
  Administered 2024-07-17: 30 mL

## 2024-07-17 MED ORDER — CEFAZOLIN SODIUM-DEXTROSE 2-4 GM/100ML-% IV SOLN: 2.0000 g | INTRAVENOUS | Status: AC

## 2024-07-17 MED ORDER — SODIUM CHLORIDE 0.9 % IV SOLN: 80.0000 mg | INTRAVENOUS | Status: AC

## 2024-07-17 MED ORDER — POVIDONE-IODINE 10 % EX SWAB
2.0000 | Freq: Once | CUTANEOUS | Status: AC
  Administered 2024-07-17: 2 via TOPICAL

## 2024-07-17 MED ORDER — ACETAMINOPHEN 325 MG PO TABS: 325.0000 mg | ORAL_TABLET | ORAL | Status: DC | PRN

## 2024-07-17 MED ORDER — ONDANSETRON HCL 4 MG/2ML IJ SOLN: 4.0000 mg | Freq: Four times a day (QID) | INTRAMUSCULAR | Status: DC | PRN

## 2024-07-17 MED ORDER — SODIUM CHLORIDE 0.9% FLUSH: 3.0000 mL | INTRAVENOUS | Status: DC | PRN

## 2024-07-17 MED ORDER — SODIUM CHLORIDE 0.9 % IV SOLN: INTRAVENOUS | Status: DC | PRN

## 2024-07-17 MED ORDER — SODIUM CHLORIDE 0.9 % IV SOLN: INTRAVENOUS | Status: DC

## 2024-07-17 MED ORDER — CHLORHEXIDINE GLUCONATE 4 % EX SOLN
4.0000 | Freq: Once | CUTANEOUS | Status: DC
  Filled 2024-07-17: qty 60

## 2024-07-17 MED ORDER — SODIUM CHLORIDE 0.9 % IV SOLN
INTRAVENOUS | Status: AC
  Administered 2024-07-17: 80 mg
  Filled 2024-07-17: qty 2

## 2024-07-17 MED ADMIN — Cefazolin Sodium-Dextrose IV Solution 2 GM/100ML-4%: 2 g | INTRAVENOUS | NDC 00338350841

## 2024-07-17 MED FILL — Cefazolin Sodium-Dextrose IV Solution 2 GM/100ML-4%: 2.0000 g | INTRAVENOUS | Qty: 100 | Status: AC

## 2024-07-17 SURGICAL SUPPLY — 4 items
CABLE SURGICAL S-101-97-12 (CABLE) ×1 IMPLANT
ELECT DEFIB PAD ADLT CADENCE (PAD) IMPLANT
IPG PACE AZUR XT DR MRI W1DR01 (Pacemaker) IMPLANT
TRAY PACEMAKER INSERTION (PACKS) ×1 IMPLANT

## 2024-07-17 NOTE — Progress Notes (Signed)
 Discharge instructions reviewed with patient and husband at the bedside. During discharge instructions husband became argumentative stating that the Doctor informed them there would be no restrictions since it was a battery change. This nurse reiterated what was instructed on the discharge paper work, and advised patient to follow instructions. Patient ambulated to the bathroom x 2. Was able to void without difficulty. Tolerated PO intake no complaints of nausea/ vomiting. Scant drainage noted on bandage at the time of discharge. Bandage was marked and drainage did not exceed marking remaining time in surgical short stay. No additional s/s of complications noted. Pt escorted from the unit via wheel chair to personal vehicle.

## 2024-07-17 NOTE — Op Note (Signed)
 Procedure report  Procedure performed:  Dual chamber pacemaker generator changeout   Reason for procedure:  1. Device generator at elective replacement interval  2. Neurocardiogenic syncope Procedure performed by:  Jerel Balding, MD  Complications:  None  Estimated blood loss:  <5 mL  Medications administered during procedure:  Ancef 2 g intravenously, lidocaine  1% 30 mL locally Device details:   New Generator Medtronic AZURE XT DR MRI model number W1 DR 01, serial number [id number] Right atrial lead (chronic) Medtronic, model number 5076/45, serial number [id number] (implanted 08/17/1998 and) Right ventricular lead (chronic) Medtronic, model number G5844320, serial number [id number] (implanted 08/17/2008)  Explanted generator Medtronic Adapta,  model number ADDRL1, serial number [id number] (implanted 08/17/2008)  Procedure details:  After the risks and benefits of the procedure were discussed the patient provided informed consent. She was brought to the cardiac catheter lab in the fasting state. The patient was prepped and draped in usual sterile fashion. Local anesthesia with 1% lidocaine  was administered to to the left infraclavicular area. A 5-6cm horizontal incision was made parallel with and 2-3 cm caudal to the left clavicle, in the area of an old scar. Using minimal electrocautery and mostly sharp and blunt dissection the prepectoral pocket was opened carefully to avoid injury to the loops of chronic leads. Extensive dissection was not necessary. The device was explanted. The pocket was carefully inspected for hemostasis and flushed with copious amounts of antibiotic solution.  The leads were disconnected from the old generator and testing of the lead parameters later showed excellent values. The new generator was connected to the chronic leads, with appropriate pacing noted.   The entire system was then carefully inserted in the pocket with care been taking that the leads and  device assumed a comfortable position without pressure on the incision. Great care was taken that the leads be located deep to the generator. The pocket was then closed in layers using 2 layers of 2-0 Vicryl, 1 layer of 3-0 Vicryl and cutaneous Steri-Strips after which a sterile dressing was applied.   At the end of the procedure the following lead parameters were encountered:   Right atrial lead sensed P waves 2.5 mV, impedance 342 ohms, threshold 0.75 at 0.4 ms pulse width.  Right ventricular lead sensed R waves 16.8 mV, impedance 437 ohms, threshold 1.0 at 0.6 ms pulse width.  Jerel Balding, MD, [facility] [phone] office [phone] pager

## 2024-07-17 NOTE — Discharge Instructions (Signed)

## 2024-07-17 NOTE — Interval H&P Note (Signed)
 History and Physical Interval Note:  07/17/2024 12:17 PM  Sherry Davis  has presented today for surgery, with the diagnosis of eri.  The various methods of treatment have been discussed with the patient and family. After consideration of risks, benefits and other options for treatment, the patient has consented to  Procedures: PPM GENERATOR CHANGEOUT (N/A) as a surgical intervention.  The patient's history has been reviewed, patient examined, no change in status, stable for surgery.  I have reviewed the patient's chart and labs.  Questions were answered to the patient's satisfaction.     Montee Tallman

## 2024-07-18 ENCOUNTER — Encounter (HOSPITAL_COMMUNITY): Payer: Self-pay | Admitting: Cardiovascular Disease

## 2024-07-27 ENCOUNTER — Ambulatory Visit: Attending: Cardiology

## 2024-07-27 DIAGNOSIS — I495 Sick sinus syndrome: Secondary | ICD-10-CM

## 2024-07-27 LAB — CUP PACEART INCLINIC DEVICE CHECK
Date Time Interrogation Session: 20251222171501
Implantable Lead Connection Status: 753985
Implantable Lead Connection Status: 753985
Implantable Lead Implant Date: 20100112
Implantable Lead Implant Date: 20100112
Implantable Lead Location: 753859
Implantable Lead Location: 753860
Implantable Lead Model: 5076
Implantable Lead Model: 5076
Implantable Pulse Generator Implant Date: 20251212

## 2024-07-27 NOTE — Patient Instructions (Signed)

## 2024-07-27 NOTE — Progress Notes (Signed)
 Normal DUAL chamber pacemaker wound check. Presenting rhythm: AS/VS 74 . Wound well healed. Routine testing performed. Thresholds, sensing, and impedance consistent with implant and chronic lead measurements. No episodes. Generator change only, no arm restrictions.  Pt enrolled in remote follow-up.  Note:  patient reports sharp increase to 120bpm in her heart rate when she exercises on the treadmill.  This has happened once and she is concerned. I do note a very low percentage of pacing at the 120-130 range.   When reviewing rate response programming, I note that patient's threshold is now programmed with a low aggressive threshold versus prior gen change programming of medium low.   PROGRAMMING CHANGE: Adjusted rate response back to a medium low aggressive threshold to see if helps with what patient has seen with initiating exercise.  She is to call back if any further concerns.

## 2024-08-09 ENCOUNTER — Ambulatory Visit

## 2024-08-10 ENCOUNTER — Encounter

## 2024-09-07 ENCOUNTER — Ambulatory Visit

## 2024-09-08 LAB — CUP PACEART REMOTE DEVICE CHECK
Battery Remaining Longevity: 177 mo
Battery Voltage: 3.22 V
Brady Statistic AP VP Percent: 0.02 %
Brady Statistic AP VS Percent: 17.4 %
Brady Statistic AS VP Percent: 0.07 %
Brady Statistic AS VS Percent: 82.51 %
Brady Statistic RA Percent Paced: 17.46 %
Brady Statistic RV Percent Paced: 0.09 %
Date Time Interrogation Session: 20260201215739
Implantable Lead Connection Status: 753985
Implantable Lead Connection Status: 753985
Implantable Lead Implant Date: 20100112
Implantable Lead Implant Date: 20100112
Implantable Lead Location: 753859
Implantable Lead Location: 753860
Implantable Lead Model: 5076
Implantable Lead Model: 5076
Implantable Pulse Generator Implant Date: 20251212
Lead Channel Impedance Value: 285 Ohm
Lead Channel Impedance Value: 342 Ohm
Lead Channel Impedance Value: 361 Ohm
Lead Channel Impedance Value: 399 Ohm
Lead Channel Pacing Threshold Amplitude: 0.625 V
Lead Channel Pacing Threshold Amplitude: 1.25 V
Lead Channel Pacing Threshold Pulse Width: 0.4 ms
Lead Channel Pacing Threshold Pulse Width: 0.4 ms
Lead Channel Sensing Intrinsic Amplitude: 15.375 mV
Lead Channel Sensing Intrinsic Amplitude: 15.375 mV
Lead Channel Sensing Intrinsic Amplitude: 3 mV
Lead Channel Sensing Intrinsic Amplitude: 3 mV
Lead Channel Setting Pacing Amplitude: 1.5 V
Lead Channel Setting Pacing Amplitude: 2.5 V
Lead Channel Setting Pacing Pulse Width: 0.4 ms
Lead Channel Setting Sensing Sensitivity: 1.2 mV
Zone Setting Status: 755011

## 2024-09-09 ENCOUNTER — Ambulatory Visit

## 2024-09-10 ENCOUNTER — Encounter

## 2024-09-10 ENCOUNTER — Ambulatory Visit: Payer: Self-pay | Admitting: Cardiovascular Disease

## 2024-10-10 ENCOUNTER — Ambulatory Visit

## 2024-10-12 ENCOUNTER — Encounter

## 2024-10-22 ENCOUNTER — Ambulatory Visit: Admitting: Cardiovascular Disease

## 2024-11-10 ENCOUNTER — Ambulatory Visit

## 2024-11-12 ENCOUNTER — Encounter

## 2024-12-07 ENCOUNTER — Ambulatory Visit

## 2025-03-08 ENCOUNTER — Ambulatory Visit

## 2025-06-07 ENCOUNTER — Ambulatory Visit
